# Patient Record
Sex: Male | Born: 1965 | Race: White | Hispanic: No | Marital: Single | Smoking: Never smoker
Health system: Southern US, Community
[De-identification: ages and names within clinical notes are randomized; demographics above are authoritative.]

## PROBLEM LIST (undated history)

## (undated) ENCOUNTER — Emergency Department (HOSPITAL_COMMUNITY): Payer: BLUE CROSS/BLUE SHIELD | Source: Home / Self Care

## (undated) DIAGNOSIS — E66811 Obesity, class 1: Secondary | ICD-10-CM

## (undated) DIAGNOSIS — R319 Hematuria, unspecified: Secondary | ICD-10-CM

## (undated) DIAGNOSIS — E669 Obesity, unspecified: Secondary | ICD-10-CM

## (undated) DIAGNOSIS — Z86010 Personal history of colonic polyps: Principal | ICD-10-CM

## (undated) DIAGNOSIS — U071 COVID-19: Secondary | ICD-10-CM

## (undated) DIAGNOSIS — G4733 Obstructive sleep apnea (adult) (pediatric): Secondary | ICD-10-CM

## (undated) DIAGNOSIS — K219 Gastro-esophageal reflux disease without esophagitis: Secondary | ICD-10-CM

## (undated) DIAGNOSIS — G4730 Sleep apnea, unspecified: Secondary | ICD-10-CM

## (undated) DIAGNOSIS — I10 Essential (primary) hypertension: Secondary | ICD-10-CM

## (undated) HISTORY — DX: Hematuria, unspecified: R31.9

## (undated) HISTORY — DX: Obesity, unspecified: E66.9

## (undated) HISTORY — DX: COVID-19: U07.1

## (undated) HISTORY — PX: COLONOSCOPY W/ POLYPECTOMY: SHX1380

## (undated) HISTORY — DX: Sleep apnea, unspecified: G47.30

## (undated) HISTORY — DX: Gastro-esophageal reflux disease without esophagitis: K21.9

## (undated) HISTORY — DX: Personal history of colonic polyps: Z86.010

## (undated) HISTORY — DX: Essential (primary) hypertension: I10

## (undated) HISTORY — PX: COLONOSCOPY: SHX174

## (undated) HISTORY — DX: Obesity, class 1: E66.811

## (undated) HISTORY — DX: Obstructive sleep apnea (adult) (pediatric): G47.33

---

## 2014-12-29 ENCOUNTER — Encounter: Payer: Self-pay | Admitting: Family Medicine

## 2014-12-29 ENCOUNTER — Ambulatory Visit (INDEPENDENT_AMBULATORY_CARE_PROVIDER_SITE_OTHER): Payer: BLUE CROSS/BLUE SHIELD | Admitting: Family Medicine

## 2014-12-29 VITALS — BP 158/93 | HR 62 | Temp 98.1°F | Resp 18 | Ht 72.25 in | Wt 242.0 lb

## 2014-12-29 DIAGNOSIS — R030 Elevated blood-pressure reading, without diagnosis of hypertension: Secondary | ICD-10-CM

## 2014-12-29 NOTE — Progress Notes (Signed)
Office Note 12/29/2014  CC:  Chief Complaint  Patient presents with  . Establish Care    not had PCP in years    HPI:  Austin Trevino is a 49 y.o. White male who is here to establish care. Patient's most recent primary MD: none (a mid-level provider at his employer's office did CPE's and labs fairly regularly up until this last year or so. Old records were not reviewed prior to or during today's visit.  Pt due for CPE with labs but is not fasting today. We discussed his hx of elevated bp, past tx for HTN, etc. He says he is UTD with all vaccines b/c he used to have to travel a lot for his job. He recently has been going through a divorce, also a change in jobs with current employer was stressful, so during these times he has noted more and more that his bp has been "up".   However, he is not yet ready to get back on meds w/out some convincing.  Past Medical History  Diagnosis Date  . Hypertension     treated "years ago" but he did some TLC/lost wt and was able to get off meds.    History reviewed. No pertinent past surgical history.  Family History  Problem Relation Age of Onset  . Arthritis Mother   . Hypertension Mother   . Hypertension Father   . Myelodysplastic syndrome Father   . Neuropathy Sister     amyloidosis  . Prostate cancer Paternal Grandfather     History   Social History  . Marital Status: Single    Spouse Name: N/A    Number of Children: N/A  . Years of Education: N/A   Occupational History  . Not on file.   Social History Main Topics  . Smoking status: Never Smoker   . Smokeless tobacco: Never Used  . Alcohol Use: Yes     Comment: socially but rare  . Drug Use: No  . Sexual Activity: Not on file   Other Topics Concern  . Not on file   Social History Narrative   Divorced, 3 sons in middle school/HS [facility]).   Educ: BS management from [place].  Orig from [place].   Occupation: Nurse, learning disability for [employer].   No tob,  alcohol intake occasional/social.    MEDS: none  No Known Allergies  ROS Review of Systems  Constitutional: Negative for fever and fatigue.  HENT: Negative for congestion and sore throat.   Eyes: Negative for visual disturbance.  Respiratory: Negative for cough and shortness of breath.   Cardiovascular: Negative for chest pain and palpitations.  Gastrointestinal: Negative for nausea and abdominal pain.  Genitourinary: Negative for dysuria.  Musculoskeletal: Negative for back pain and joint swelling.  Skin: Negative for rash.  Neurological: Negative for dizziness, weakness and headaches.  Hematological: Negative for adenopathy.    PE; Blood pressure 158/93, pulse 62, temperature 98.1 F (36.7 C), temperature source Temporal, resp. rate 18, height 6' 0.25" (1.835 m), weight 242 lb (109.77 kg), SpO2 96 %.  BP recheck with manual cuff, R arm, was 150/90. Gen: Alert, well appearing.  Patient is oriented to person, place, time, and situation. OQH:UTML: no injection, icteris, swelling, or exudate.  EOMI, PERRLA. Mouth: lips without lesion/swelling.  Oral mucosa pink and moist. Oropharynx without erythema, exudate, or swelling.  CV: RRR, no m/r/g.   LUNGS: CTA bilat, nonlabored resps, good aeration in all lung fields. EXT: no clubbing, cyanosis, or edema.  Pertinent labs:  None today  ASSESSMENT AND PLAN:   New pt; would like old labs from his employer's health clinic.  1) Elevated blood pressure, hx of HTN. He is set on getting back on track with diet and exercise b/c in the past on two occasions this has helped get his bp down so that he could either get off of med or avoid antihypertensive med. I have asked him to buy an upper arm bp cuff and we'll do some home bp monitoring for more data to go on. At f/u for his fasting CPE in a couple of weeks, I'll also do an EKG to check for LVH.  An After Visit Summary was printed and given to the patient.  Return for arrange CPE  (fasting) in 2 or more weeks.

## 2014-12-29 NOTE — Progress Notes (Signed)
Pre visit review using our clinic review tool, if applicable. No additional management support is needed unless otherwise documented below in the visit note. 

## 2014-12-29 NOTE — Patient Instructions (Signed)
Buy electronic bp cuff (upper arm)--approx 50$ at any drug store.

## 2015-01-12 ENCOUNTER — Encounter: Payer: Self-pay | Admitting: Family Medicine

## 2015-01-21 ENCOUNTER — Encounter: Payer: Self-pay | Admitting: Family Medicine

## 2015-01-21 ENCOUNTER — Ambulatory Visit (INDEPENDENT_AMBULATORY_CARE_PROVIDER_SITE_OTHER): Payer: BLUE CROSS/BLUE SHIELD | Admitting: Family Medicine

## 2015-01-21 ENCOUNTER — Telehealth: Payer: Self-pay | Admitting: Internal Medicine

## 2015-01-21 VITALS — BP 143/90 | HR 61 | Temp 97.7°F | Resp 18 | Ht 72.25 in | Wt 241.0 lb

## 2015-01-21 DIAGNOSIS — G4733 Obstructive sleep apnea (adult) (pediatric): Secondary | ICD-10-CM

## 2015-01-21 DIAGNOSIS — I10 Essential (primary) hypertension: Secondary | ICD-10-CM

## 2015-01-21 DIAGNOSIS — Z0000 Encounter for general adult medical examination without abnormal findings: Secondary | ICD-10-CM

## 2015-01-21 DIAGNOSIS — J387 Other diseases of larynx: Secondary | ICD-10-CM

## 2015-01-21 DIAGNOSIS — Z125 Encounter for screening for malignant neoplasm of prostate: Secondary | ICD-10-CM

## 2015-01-21 DIAGNOSIS — K219 Gastro-esophageal reflux disease without esophagitis: Secondary | ICD-10-CM

## 2015-01-21 DIAGNOSIS — Z1211 Encounter for screening for malignant neoplasm of colon: Secondary | ICD-10-CM

## 2015-01-21 DIAGNOSIS — G4710 Hypersomnia, unspecified: Secondary | ICD-10-CM

## 2015-01-21 LAB — COMPREHENSIVE METABOLIC PANEL
ALBUMIN: 4.4 g/dL (ref 3.5–5.2)
ALT: 35 U/L (ref 0–53)
AST: 20 U/L (ref 0–37)
Alkaline Phosphatase: 72 U/L (ref 39–117)
BUN: 13 mg/dL (ref 6–23)
CO2: 33 meq/L — AB (ref 19–32)
Calcium: 9.6 mg/dL (ref 8.4–10.5)
Chloride: 102 mEq/L (ref 96–112)
Creatinine, Ser: 1.11 mg/dL (ref 0.40–1.50)
GFR: 74.36 mL/min (ref >60.00)
GLUCOSE: 85 mg/dL (ref 70–99)
POTASSIUM: 4.7 meq/L (ref 3.5–5.1)
SODIUM: 140 meq/L (ref 135–145)
TOTAL PROTEIN: 7.3 g/dL (ref 6.0–8.3)
Total Bilirubin: 1.1 mg/dL (ref 0.2–1.2)

## 2015-01-21 LAB — CBC WITH DIFFERENTIAL/PLATELET
BASOS PCT: 0.4 % (ref 0.0–3.0)
Basophils Absolute: 0 10*3/uL (ref 0.0–0.1)
Eosinophils Absolute: 0.4 10*3/uL (ref 0.0–0.7)
Eosinophils Relative: 4.7 % (ref 0.0–5.0)
HEMATOCRIT: 51.1 % (ref 39.0–52.0)
HEMOGLOBIN: 17.5 g/dL — AB (ref 13.0–17.0)
LYMPHS ABS: 1.6 10*3/uL (ref 0.7–4.0)
Lymphocytes Relative: 17.9 % (ref 12.0–46.0)
MCHC: 34.1 g/dL (ref 30.0–36.0)
MCV: 92.6 fl (ref 78.0–100.0)
MONOS PCT: 10 % (ref 3.0–12.0)
Monocytes Absolute: 0.9 10*3/uL (ref 0.1–1.0)
NEUTROS ABS: 6.1 10*3/uL (ref 1.4–7.7)
Neutrophils Relative %: 67 % (ref 43.0–77.0)
Platelets: 230 10*3/uL (ref 150.0–400.0)
RBC: 5.52 Mil/uL (ref 4.22–5.81)
RDW: 14.3 % (ref 11.5–15.5)
WBC: 9.1 10*3/uL (ref 4.0–10.5)

## 2015-01-21 LAB — LIPID PANEL
CHOL/HDL RATIO: 5
Cholesterol: 200 mg/dL (ref 0–200)
HDL: 42.6 mg/dL (ref >39.00)
LDL Cholesterol: 119 mg/dL — ABNORMAL HIGH (ref 0–99)
NONHDL: 157.4
Triglycerides: 194 mg/dL — ABNORMAL HIGH (ref 0.0–149.0)
VLDL: 38.8 mg/dL (ref 0.0–40.0)

## 2015-01-21 LAB — PSA: PSA: 1.38 ng/mL (ref 0.10–4.00)

## 2015-01-21 LAB — TSH: TSH: 1.7 u[IU]/mL (ref 0.35–4.50)

## 2015-01-21 MED ORDER — PANTOPRAZOLE SODIUM 40 MG PO TBEC: 40.0000 mg | DELAYED_RELEASE_TABLET | Freq: Every day | ORAL | Status: DC

## 2015-01-21 NOTE — Progress Notes (Signed)
Pre visit review using our clinic review tool, if applicable. No additional management support is needed unless otherwise documented below in the visit note. 

## 2015-01-21 NOTE — Progress Notes (Signed)
Office Note 01/25/2015  CC:  Chief Complaint  Patient presents with  . Annual Exam    fasting   HPI:  Austin Trevino is a 49 y.o. White male who is here for fasting health maintenance exam. Pt just established care with me a few weeks ago.  He talks about his stress in life, feels tired a lot. Says he has snored loudly for years, has been told he stops breathing during sleep.  Describes excessive daytime sleepiness.  He got a bp cuff and has noted home bp's 150s over 90s avg.  HR 60s.  Describes several days a week in which he gets gagged on refluxed liquid, coughs a lot, ST often in mornings. Has never tried GER med.  Past Medical History  Diagnosis Date  . Hypertension     treated "years ago" but he did some TLC/lost wt and was able to get off meds.  . Obesity, Class I, BMI 30-34.9     No past surgical history on file.  Family History  Problem Relation Age of Onset  . Arthritis Mother   . Hypertension Mother   . Hypertension Father   . Myelodysplastic syndrome Father   . Neuropathy Sister     amyloidosis  . Prostate cancer Paternal Grandfather   . Colon polyps Mother     History   Social History  . Marital Status: Single    Spouse Name: N/A  . Number of Children: N/A  . Years of Education: N/A   Occupational History  . Not on file.   Social History Main Topics  . Smoking status: Never Smoker   . Smokeless tobacco: Never Used  . Alcohol Use: Yes     Comment: socially but rare  . Drug Use: No  . Sexual Activity: Not on file   Other Topics Concern  . Not on file   Social History Narrative   Divorced, 3 sons in middle school/HS [facility] [employer] [employer] [facility]).   Educ: BS management from [place].  Orig from [place].   Occupation: Nurse, learning disability for [employer].   No tob, alcohol intake occasional/social.   MEDS: none  No Known Allergies  ROS Review of Systems  Constitutional: Negative for fever, chills, appetite change and fatigue.  HENT: Negative  for congestion, dental problem, ear pain and sore throat.   Eyes: Negative for discharge, redness and visual disturbance.  Respiratory: Negative for cough, chest tightness, shortness of breath and wheezing.   Cardiovascular: Negative for chest pain, palpitations and leg swelling.  Gastrointestinal: Negative for nausea, vomiting, abdominal pain, diarrhea and blood in stool.  Genitourinary: Negative for dysuria, urgency, frequency, hematuria, flank pain and difficulty urinating.  Musculoskeletal: Negative for myalgias, back pain, joint swelling, arthralgias and neck stiffness.  Skin: Negative for pallor and rash.  Neurological: Negative for dizziness, speech difficulty, weakness and headaches.  Hematological: Negative for adenopathy. Does not bruise/bleed easily.  Psychiatric/Behavioral: Positive for sleep disturbance. Negative for confusion. The patient is nervous/anxious ("stressed").     PE; Blood pressure 143/90, pulse 61, temperature 97.7 F (36.5 C), temperature source Temporal, resp. rate 18, height 6' 0.25" (1.835 m), weight 241 lb (109.317 kg), SpO2 97 %.  BMI 32.5 Gen: Alert, well appearing.  Patient is oriented to person, place, time, and situation. AFFECT: pleasant, lucid thought and speech. ENT: Ears: EACs clear, normal epithelium.  TMs with good light reflex and landmarks bilaterally.  Eyes: no injection, icteris, swelling, or exudate.  EOMI, PERRLA. Nose: no drainage or turbinate edema/swelling.  No  injection or focal lesion.  Mouth: lips without lesion/swelling.  Oral mucosa pink and moist.  Dentition intact and without obvious caries or gingival swelling.  Oropharynx with diffuse erythema including the soft palate.  No exudate, focal lesion, asymmetry, or swelling.  Neck: supple/nontender.  No LAD, mass, or TM.  Carotid pulses 2+ bilaterally, without bruits. CV: RRR, no m/r/g.   LUNGS: CTA bilat, nonlabored resps, good aeration in all lung fields. ABD: soft, NT, ND, BS normal.   No hepatospenomegaly or mass.  No bruits. EXT: no clubbing, cyanosis, or edema.  Musculoskeletal: no joint swelling, erythema, warmth, or tenderness.  ROM of all joints intact. Skin - no sores or suspicious lesions or rashes or color changes   Pertinent labs:  None today  ASSESSMENT AND PLAN:   1) HTN, stage 1.  Pt not agreeable to start of med at this time.  Wants to do TLC only + home bp monitoring and re-evaluate in 3 mo.  2) Laryngopharyngeal reflux: discussed dietary changes, GER diet handout given, discussed behavioral changes and wt loss, start daily pantoprazole 40mg  qAM.  3) OSA: refer to pulm.  Emphasized importance of wt loss.  4) Health maintenance exam:  Reviewed age and gender appropriate health maintenance issues (prudent diet, regular exercise, health risks of tobacco and excessive alcohol, use of seatbelts, fire alarms in home, use of sunscreen).  Also reviewed age and gender appropriate health screening as well as vaccine recommendations. HP and PSA labs drawn today.  Pt declined HIV screening. Referred to [facility] for colon cancer screening. Getting started with regular exercise was the chief recommendation I had for him today.  An After Visit Summary was printed and given to the patient.   FOLLOW UP:  Return in about 3 months (around 04/21/2015) for f/u HTN and LPR.

## 2015-01-21 NOTE — Telephone Encounter (Signed)
ERROR

## 2015-01-22 ENCOUNTER — Telehealth: Payer: Self-pay | Admitting: Family Medicine

## 2015-01-22 NOTE — Telephone Encounter (Signed)
emmi emailed °

## 2015-01-22 NOTE — Progress Notes (Signed)
Pls notify pt also that I FORGOT to do the rectal exam of his prostate when he was here recently. I will do that at his next f/u visit if that's ok with him.-thx

## 2015-01-27 ENCOUNTER — Ambulatory Visit (AMBULATORY_SURGERY_CENTER): Payer: Self-pay

## 2015-01-27 VITALS — Ht 73.0 in | Wt 244.8 lb

## 2015-01-27 DIAGNOSIS — Z1211 Encounter for screening for malignant neoplasm of colon: Secondary | ICD-10-CM

## 2015-01-27 DIAGNOSIS — G4733 Obstructive sleep apnea (adult) (pediatric): Secondary | ICD-10-CM

## 2015-01-27 HISTORY — DX: Obstructive sleep apnea (adult) (pediatric): G47.33

## 2015-01-27 NOTE — Progress Notes (Signed)
Per pt, no allergies to soy or egg products.Pt not taking any weight loss meds or using  O2 at home. 

## 2015-02-10 ENCOUNTER — Encounter: Payer: Self-pay | Admitting: Internal Medicine

## 2015-02-10 ENCOUNTER — Ambulatory Visit (AMBULATORY_SURGERY_CENTER): Payer: BLUE CROSS/BLUE SHIELD | Admitting: Internal Medicine

## 2015-02-10 VITALS — BP 158/81 | HR 55 | Temp 97.9°F | Resp 24 | Ht 73.0 in | Wt 244.0 lb

## 2015-02-10 DIAGNOSIS — K621 Rectal polyp: Secondary | ICD-10-CM

## 2015-02-10 DIAGNOSIS — Z1211 Encounter for screening for malignant neoplasm of colon: Secondary | ICD-10-CM

## 2015-02-10 DIAGNOSIS — D120 Benign neoplasm of cecum: Secondary | ICD-10-CM | POA: Diagnosis not present

## 2015-02-10 DIAGNOSIS — K635 Polyp of colon: Secondary | ICD-10-CM

## 2015-02-10 DIAGNOSIS — D122 Benign neoplasm of ascending colon: Secondary | ICD-10-CM | POA: Diagnosis not present

## 2015-02-10 DIAGNOSIS — D125 Benign neoplasm of sigmoid colon: Secondary | ICD-10-CM

## 2015-02-10 DIAGNOSIS — D123 Benign neoplasm of transverse colon: Secondary | ICD-10-CM

## 2015-02-10 MED ORDER — SODIUM CHLORIDE 0.9 % IV SOLN
500.0000 mL | INTRAVENOUS | Status: DC
Start: 2015-02-10 — End: 2015-02-10

## 2015-02-10 NOTE — Progress Notes (Signed)
To recovery, report to Hodges,RN. VSS 

## 2015-02-10 NOTE — Progress Notes (Signed)
Called to room to assist during endoscopic procedure.  Patient ID and intended procedure confirmed with present staff. Received instructions for my participation in the procedure from the performing physician.  

## 2015-02-10 NOTE — Patient Instructions (Addendum)
I found and removed 10 polyps - all look benign. I will let you know pathology results and when to have another routine colonoscopy by mail.  You also have a condition called diverticulosis - common and not usually a problem. Please read the handout provided.  I appreciate the opportunity to care for you. Gatha Mayer, MD, FACG  HOLD ALL ASPIRIN AND NSAIDS FOR TWO WEEKS PER DR Yareni Creps    YOU HAD AN ENDOSCOPIC PROCEDURE TODAY AT [facility]:   Refer to the procedure report that was given to you for any specific questions about what was found during the examination.  If the procedure report does not answer your questions, please call your gastroenterologist to clarify.  If you requested that your care partner not be given the details of your procedure findings, then the procedure report has been included in a sealed envelope for you to review at your convenience later.  YOU SHOULD EXPECT: Some feelings of bloating in the abdomen. Passage of more gas than usual.  Walking can help get rid of the air that was put into your GI tract during the procedure and reduce the bloating. If you had a lower endoscopy (such as a colonoscopy or flexible sigmoidoscopy) you may notice spotting of blood in your stool or on the toilet paper. If you underwent a bowel prep for your procedure, you may not have a normal bowel movement for a few days.  Please Note:  You might notice some irritation and congestion in your nose or some drainage.  This is from the oxygen used during your procedure.  There is no need for concern and it should clear up in a day or so.  SYMPTOMS TO REPORT IMMEDIATELY:   Following lower endoscopy (colonoscopy or flexible sigmoidoscopy):  Excessive amounts of blood in the stool  Significant tenderness or worsening of abdominal pains  Swelling of the abdomen that is new, acute  Fever of 100F or higher   For urgent or emergent issues, a gastroenterologist can be  reached at any hour by calling [phone].   DIET: Your first meal following the procedure should be a small meal and then it is ok to progress to your normal diet. Heavy or fried foods are harder to digest and may make you feel nauseous or bloated.  Likewise, meals heavy in dairy and vegetables can increase bloating.  Drink plenty of fluids but you should avoid alcoholic beverages for 24 hours.  ACTIVITY:  You should plan to take it easy for the rest of today and you should NOT DRIVE or use heavy machinery until tomorrow (because of the sedation medicines used during the test).    FOLLOW UP: Our staff will call the number listed on your records the next business day following your procedure to check on you and address any questions or concerns that you may have regarding the information given to you following your procedure. If we do not reach you, we will leave a message.  However, if you are feeling well and you are not experiencing any problems, there is no need to return our call.  We will assume that you have returned to your regular daily activities without incident.  If any biopsies were taken you will be contacted by phone or by letter within the next 1-3 weeks.  Please call us at [phone] if you have not heard about the biopsies in 3 weeks.    SIGNATURES/CONFIDENTIALITY: You and/or your care partner  have signed paperwork which will be entered into your electronic medical record.  These signatures attest to the fact that that the information above on your After Visit Summary has been reviewed and is understood.  Full responsibility of the confidentiality of this discharge information lies with you and/or your care-partner.  Read all of the handouts given to you by your recovery room nurse.  We will see you in one year for a repeat colonoscopy due to your numerous polyps.

## 2015-02-10 NOTE — Op Note (Signed)
[place]  [address]. [place], [address]   COLONOSCOPY PROCEDURE REPORT  PATIENT: Austin, Trevino  MR#: [id number] BIRTHDATE: 09-May-1966 , 48  yrs. old GENDER: male ENDOSCOPIST: Gatha Mayer, MD, [facility] PROCEDURE DATE:  02/10/2015 PROCEDURE:   Colonoscopy, screening, Colonoscopy with biopsy, and Colonoscopy with snare polypectomy First Screening Colonoscopy - Avg.  risk and is 50 yrs.  old or older Yes.  Prior Negative Screening - Now for repeat screening. N/A  History of Adenoma - Now for follow-up colonoscopy & has been > or = to 3 yrs.  N/A ASA CLASS:   Class II INDICATIONS:Screening for colonic neoplasia and Colorectal Neoplasm Risk Assessment for this procedure is average risk. MEDICATIONS: Propofol 400 mg IV and Monitored anesthesia care  DESCRIPTION OF PROCEDURE:   After the risks benefits and alternatives of the procedure were thoroughly explained, informed consent was obtained.  The digital rectal exam revealed no abnormalities of the rectum, revealed no prostatic nodules, and revealed the prostate was not enlarged.   The LB [id number] endoscope was introduced through the anus and advanced to the cecum, which was identified by both the appendix and ileocecal valve. No adverse events experienced.   The quality of the prep was good.  (MiraLax was used)  The instrument was then slowly withdrawn as the colon was fully examined.      COLON FINDINGS: Ten polyps (sessile or semi-pedunculated) ranging from 2 to 65mm in size were found in the descending colon, sigmoid colon, rectum, at the ileocecal valve, in the ascending colon, and transverse colon.  Polypectomies  were performed using snare cautery (IC valve, ascending and sigmoid), with a cold snare (ascending and trransverse)and with cold forceps (ascending and transverse).  The resection was complete, the polyp tissue was completely retrieved and sent to histology. No combined  polypectomy techniques.  There was diverticulosis noted in the sigmoid colon. The examination was otherwise normal.   Right colon retroflexion included.  Retroflexed views revealed no abnormalities. The time to cecum = 1.3 Withdrawal time = 25.1   The scope was withdrawn and the procedure completed. COMPLICATIONS: There were no immediate complications.  ENDOSCOPIC IMPRESSION: 1.   Ten sessile polyps ranging from 2 to 54mm in size were found in the descending colon, sigmoid colon, rectum, at the ileocecal valve, in the ascending colon, and transverse colon; polypectomies were performed using snare cautery, with a cold snare and with cold forceps. Largest was a 15 mm sessile ascending polyp - laterally speading) 2.   Diverticulosis was noted in the sigmoid colon 3.   The examination was otherwise normal  RECOMMENDATIONS: 1.  Hold Aspirin and all other NSAIDS for 2 weeks. 2.  Timing of repeat colonoscopy will be determined by pathology findings. Probably 1 year  eSigned:  Gatha Mayer, MD, [facility] 02/10/2015 10:55 AM   cc: Crissie Sickles, MD and The Patient   PATIENT NAME:  Austin, Trevino MR#: [id number]

## 2015-02-11 ENCOUNTER — Telehealth: Payer: Self-pay

## 2015-02-11 NOTE — Telephone Encounter (Signed)
  Follow up Call-  Call back number 02/10/2015  Post procedure Call Back phone  # [phone]  Permission to leave phone message Yes     Patient questions:  Do you have a fever, pain , or abdominal swelling? No. Pain Score  0 *  Have you tolerated food without any problems? Yes.    Have you been able to return to your normal activities? Yes.    Do you have any questions about your discharge instructions: Diet   No. Medications  No. Follow up visit  No.  Do you have questions or concerns about your Care? No.  Actions: * If pain score is 4 or above: No action needed, pain <4.

## 2015-02-18 ENCOUNTER — Encounter: Payer: Self-pay | Admitting: Internal Medicine

## 2015-02-18 DIAGNOSIS — Z860100 Personal history of colon polyps, unspecified: Secondary | ICD-10-CM

## 2015-02-18 HISTORY — DX: Personal history of colon polyps, unspecified: Z86.0100

## 2015-02-18 HISTORY — DX: Personal history of colonic polyps: Z86.010

## 2015-03-16 ENCOUNTER — Encounter: Payer: Self-pay | Admitting: Pulmonary Disease

## 2015-03-16 ENCOUNTER — Ambulatory Visit (INDEPENDENT_AMBULATORY_CARE_PROVIDER_SITE_OTHER): Payer: BLUE CROSS/BLUE SHIELD | Admitting: Pulmonary Disease

## 2015-03-16 VITALS — BP 148/74 | HR 61 | Temp 98.7°F | Ht 73.0 in | Wt 240.6 lb

## 2015-03-16 DIAGNOSIS — G4733 Obstructive sleep apnea (adult) (pediatric): Secondary | ICD-10-CM | POA: Insufficient documentation

## 2015-03-16 NOTE — Progress Notes (Signed)
   Subjective:    Patient ID: Austin Trevino, male    DOB: 1966/11/19, 49 y.o.   MRN: [id number]  HPI The patient is a 49 year old male who I've been asked to see for possible obstructive sleep apnea. He has been noted to have loud snoring, as well as observed apneas. He has frequent awakenings during the night, and the majority of the time he is unrested upon arising. He notes inappropriate daytime sleepiness with quiet moments, and variable sleepiness in the evenings watching television or movies. He denies any sleepiness with driving. The patient states that his weight is been stable over the last 2 years, and his Epworth score today is 7.   Sleep Questionnaire What time do you typically go to bed?( Between what hours) 11p-12a 11p-12a at 1515 on 03/16/15 by Inge Rise, CMA How long does it take you to fall asleep? quickly quickly at 1515 on 03/16/15 by Inge Rise, CMA How many times during the night do you wake up? 5 5 at 1515 on 03/16/15 by Inge Rise, CMA What time do you get out of bed to start your day? 0500 0500 at 1515 on 03/16/15 by Inge Rise, CMA Do you drive or operate heavy machinery in your occupation? No No at 1515 on 03/16/15 by Inge Rise, CMA How much has your weight changed (up or down) over the past two years? (In pounds) 0 oz (0 kg) 0 oz (0 kg) at 1515 on 03/16/15 by Inge Rise, CMA Have you ever had a sleep study before? No No at 1515 on 03/16/15 by Inge Rise, CMA Do you currently use CPAP? No No at 1515 on 03/16/15 by Inge Rise, CMA Do you wear oxygen at any time? No No at 1515 on 03/16/15 by Inge Rise, CMA   Review of Systems  Constitutional: Negative for fever and unexpected weight change.  HENT: Positive for sore throat. Negative for congestion, dental problem, ear pain, nosebleeds, postnasal drip, rhinorrhea, sinus pressure, sneezing and trouble swallowing.   Eyes: Negative for redness and itching.  Respiratory:  Positive for cough and shortness of breath. Negative for chest tightness and wheezing.   Cardiovascular: Negative for palpitations and leg swelling.  Gastrointestinal: Negative for nausea and vomiting.  Genitourinary: Negative for dysuria.  Musculoskeletal: Negative for joint swelling.  Skin: Negative for rash.  Neurological: Negative for headaches.  Hematological: Does not bruise/bleed easily.  Psychiatric/Behavioral: Negative for dysphoric mood. The patient is nervous/anxious.        Objective:   Physical Exam Constitutional:  Overweight male, no acute distress  HENT:  Nares patent without discharge  Oropharynx without exudate, palate and uvula are thick and elongated  Eyes:  Perrla, eomi, no scleral icterus  Neck:  No JVD, no TMG  Cardiovascular:  Normal rate, regular rhythm, no rubs or gallops.  No murmurs        Intact distal pulses  Pulmonary :  Normal breath sounds, no stridor or respiratory distress   No rales, rhonchi, or wheezing  Abdominal:  Soft, nondistended, bowel sounds present.  No tenderness noted.   Musculoskeletal:  No lower extremity edema noted.  Lymph Nodes:  No cervical lymphadenopathy noted  Skin:  No cyanosis noted  Neurologic:  Alert, appropriate, moves all 4 extremities without obvious deficit.         Assessment & Plan:

## 2015-03-16 NOTE — Patient Instructions (Signed)
Will arrange for home sleep testing, and will call once results are available Work on weight loss.

## 2015-03-16 NOTE — Assessment & Plan Note (Signed)
The patient's history is very suggestive of clinically significant sleep disordered breathing. I have had a long conversation with him about sleep apnea, including its impact to his quality of life and cardiovascular health. He will need a sleep study for diagnosis, and he has a very good candidate for home sleep testing. The patient is agreeable to this approach.

## 2015-03-25 ENCOUNTER — Encounter: Payer: Self-pay | Admitting: Family Medicine

## 2015-04-24 ENCOUNTER — Ambulatory Visit: Payer: BLUE CROSS/BLUE SHIELD | Admitting: Family Medicine

## 2015-05-15 ENCOUNTER — Other Ambulatory Visit: Payer: Self-pay | Admitting: Pulmonary Disease

## 2015-05-15 DIAGNOSIS — G4733 Obstructive sleep apnea (adult) (pediatric): Secondary | ICD-10-CM

## 2015-05-29 ENCOUNTER — Ambulatory Visit: Payer: BLUE CROSS/BLUE SHIELD | Admitting: Family Medicine

## 2015-06-29 ENCOUNTER — Encounter: Payer: Self-pay | Admitting: Family Medicine

## 2015-06-29 ENCOUNTER — Ambulatory Visit (INDEPENDENT_AMBULATORY_CARE_PROVIDER_SITE_OTHER): Payer: BLUE CROSS/BLUE SHIELD | Admitting: Family Medicine

## 2015-06-29 VITALS — BP 140/84 | HR 60 | Temp 98.1°F | Ht 73.0 in | Wt 241.0 lb

## 2015-06-29 DIAGNOSIS — R21 Rash and other nonspecific skin eruption: Secondary | ICD-10-CM

## 2015-06-29 MED ORDER — HYDROCORTISONE 0.5 % EX CREA: 1.0000 "application " | TOPICAL_CREAM | Freq: Two times a day (BID) | CUTANEOUS | Status: DC

## 2015-06-29 MED ORDER — PREDNISONE 50 MG PO TABS: 50.0000 mg | ORAL_TABLET | Freq: Every day | ORAL | Status: DC

## 2015-06-29 NOTE — Assessment & Plan Note (Signed)
Austin Trevino is a 49 y.o. male presents to OV for new onset of rash. - rash appears to be consistent with atopic dermatitis by appearance, although not pruritic. Considered  shingles considering location and pattern, however not painful or sensitive to him.  - Prednisone x5 days, not diabetic. - hydrocortisone topical BID prescribed.  - AVS contact derm, discussed watching for possible irritants.  - F/u 1 week if not resolving, or sooner if worsening.

## 2015-06-29 NOTE — Progress Notes (Signed)
   Subjective:    Patient ID: Austin Trevino, male    DOB: 1966/11/10, 49 y.o.   MRN: [id number]  HPI  Rash: patient presents with a rash over his left shoulder blade that started Saturday with a small "bump". He reports it has spread since Saturday from 1 bump to a large area on his back. He admits to a similar reaction a few months ago on his wrist, but that rash was pruritic and went away quickly. He denies pain, itchiness or drainage to the rash. He denies fever, nausea, vomit or headache. He does not recall changing any laundry detergent, soaps, shampoos etc. He has not been exposed to plants/outdoors that he is aware of. He reports finding occasional ticks "crawling" but nothing recent and no tick bites. He has no allergies he is aware of. He has not change any medications or travel outside [place]. He has not tried any OTC to help with rash.   Never Smoker Past Medical History  Diagnosis Date  . Hypertension     treated "years ago" but he did some TLC/lost wt and was able to get off meds.  . Obesity, Class I, BMI 30-34.9   . GERD (gastroesophageal reflux disease)   . Hx of colonic polyps - adenomas and ssp 02/18/2015  . OSA (obstructive sleep apnea) 01/2015    Suspected: eval by Dr. Gwenette Greet 02/2015, sleep study recommended.   No Known Allergies   Review of Systems Negative, with the exception of above mentioned in HPI     Objective:   Physical Exam BP 140/84 mmHg  Pulse 60  Temp(Src) 98.1 F (36.7 C) (Oral)  Ht 6\' 1"  (1.854 m)  Wt 241 lb (109.317 kg)  BMI 31.80 kg/m2  SpO2 96% Gen: NAD. Nontoxic in appearance, pleasant caucasian male. Well dressed, well nourished.  Skin: No  purpura or petechiae. Moderately raised red, vesicular like rash left shoulder blade ~4x3 inches, and left bicep 3-4 small raised red bumps.       Assessment & Plan:  Zayan Delvecchio is a 49 y.o. male presents to OV for new onset of rash. - rash appears to be consistent with atopic dermatitis by  appearance, although not pruritic. Considered  shingles considering location and pattern, however not painful or sensitive to him.  - Prednisone x5 days, not diabetic. - hydrocortisone topical BID prescribed.  - AVS contact derm, discussed watching for possible irritants.  - F/u 1 week if not resolving, or sooner if worsening.

## 2015-06-29 NOTE — Patient Instructions (Signed)
I have prescribed you prednisone for 5 days, this is a steroid. You should see an improvement within a few days. If you do not see any improvement or the rash worsens, becomes painful or appears infected then please be seen immediately.  Follow up in one week in if no improvement. It was a pleasure meeting you.   Contact Dermatitis Contact dermatitis is a reaction to certain substances that touch the skin. Contact dermatitis can be either irritant contact dermatitis or allergic contact dermatitis. Irritant contact dermatitis does not require previous exposure to the substance for a reaction to occur.Allergic contact dermatitis only occurs if you have been exposed to the substance before. Upon a repeat exposure, your body reacts to the substance.  CAUSES  Many substances can cause contact dermatitis. Irritant dermatitis is most commonly caused by repeated exposure to mildly irritating substances, such as:  Makeup.  Soaps.  Detergents.  Bleaches.  Acids.  Metal salts, such as nickel. Allergic contact dermatitis is most commonly caused by exposure to:  Poisonous plants.  Chemicals (deodorants, shampoos).  Jewelry.  Latex.  Neomycin in triple antibiotic cream.  Preservatives in products, including clothing. SYMPTOMS  The area of skin that is exposed may develop:  Dryness or flaking.  Redness.  Cracks.  Itching.  Pain or a burning sensation.  Blisters. With allergic contact dermatitis, there may also be swelling in areas such as the eyelids, mouth, or genitals.  DIAGNOSIS  Your caregiver can usually tell what the problem is by doing a physical exam. In cases where the cause is uncertain and an allergic contact dermatitis is suspected, a patch skin test may be performed to help determine the cause of your dermatitis. TREATMENT Treatment includes protecting the skin from further contact with the irritating substance by avoiding that substance if possible. Barrier creams,  powders, and gloves may be helpful. Your caregiver may also recommend:  Steroid creams or ointments applied 2 times daily. For best results, soak the rash area in cool water for 20 minutes. Then apply the medicine. Cover the area with a plastic wrap. You can store the steroid cream in the refrigerator for a "chilly" effect on your rash. That may decrease itching. Oral steroid medicines may be needed in more severe cases.  Antibiotics or antibacterial ointments if a skin infection is present.  Antihistamine lotion or an antihistamine taken by mouth to ease itching.  Lubricants to keep moisture in your skin.  Burow's solution to reduce redness and soreness or to dry a weeping rash. Mix one packet or tablet of solution in 2 cups cool water. Dip a clean washcloth in the mixture, wring it out a bit, and put it on the affected area. Leave the cloth in place for 30 minutes. Do this as often as possible throughout the day.  Taking several cornstarch or baking soda baths daily if the area is too large to cover with a washcloth. Harsh chemicals, such as alkalis or acids, can cause skin damage that is like a burn. You should flush your skin for 15 to 20 minutes with cold water after such an exposure. You should also seek immediate medical care after exposure. Bandages (dressings), antibiotics, and pain medicine may be needed for severely irritated skin.  HOME CARE INSTRUCTIONS  Avoid the substance that caused your reaction.  Keep the area of skin that is affected away from hot water, soap, sunlight, chemicals, acidic substances, or anything else that would irritate your skin.  Do not scratch the rash. Scratching  may cause the rash to become infected.  You may take cool baths to help stop the itching.  Only take over-the-counter or prescription medicines as directed by your caregiver.  See your caregiver for follow-up care as directed to make sure your skin is healing properly. SEEK MEDICAL CARE IF:    Your condition is not better after 3 days of treatment.  You seem to be getting worse.  You see signs of infection such as swelling, tenderness, redness, soreness, or warmth in the affected area.  You have any problems related to your medicines. Document Released: 11/11/2000 Document Revised: 02/06/2012 Document Reviewed: 04/19/2011 [facility] Patient Information 2015 [place], [place]. This information is not intended to replace advice given to you by your health care provider. Make sure you discuss any questions you have with your health care provider.

## 2015-06-30 ENCOUNTER — Telehealth: Payer: Self-pay | Admitting: Pulmonary Disease

## 2015-06-30 ENCOUNTER — Telehealth: Payer: Self-pay | Admitting: *Deleted

## 2015-06-30 ENCOUNTER — Telehealth: Payer: Self-pay | Admitting: Family Medicine

## 2015-06-30 NOTE — Telephone Encounter (Signed)
Order for Home Sleep Test put in 03/16/2015. Patient was called 4 times and schedule 1 time with no show

## 2015-06-30 NOTE — Telephone Encounter (Signed)
Error

## 2015-06-30 NOTE — Telephone Encounter (Signed)
Austin Trevino called and said his rash is spreading, it has developed blisters and is very sensitive. Pharmacy messed up his RX for prednisone so he has not started it yet. DO you want him to start it and do you think he has shingles? Please advise? His cell number is [phone]

## 2015-06-30 NOTE — Telephone Encounter (Signed)
Returned patient call concerning his rash. He states the rash has spread, but he had not had the chance to start the prednisone. He has noticed it is spreading up close to his neck (was over shoulder blade C5/C6/C7/C8/T1 dermatome left). He also noticed the area on his left bicep has a new area, which a few inches away and higher on his arm. This would also be in a different dermatome pattern by description, making shingles less likely. He does admit to more irritation/mild burn to the area today, but not painful. - advised patient to start prednisone (he was able to start it 2 hours ago).  - Continue topical cream/steroid. - monitor closely over next 48 hours. If improves with steroid continue, if becomes worse over the next 24-48 hours he needs discontinue steroid and be seen again.  [place] DO

## 2015-07-27 ENCOUNTER — Other Ambulatory Visit: Payer: Self-pay | Admitting: *Deleted

## 2015-07-27 MED ORDER — PANTOPRAZOLE SODIUM 40 MG PO TBEC: 40.0000 mg | DELAYED_RELEASE_TABLET | Freq: Every day | ORAL | Status: DC

## 2015-07-27 NOTE — Telephone Encounter (Signed)
RF request for pantoprazole LOV: 06/29/15 Next ov:  None Last written: 01/21/15 #30 w/ #RF

## 2015-12-23 ENCOUNTER — Other Ambulatory Visit: Payer: Self-pay | Admitting: *Deleted

## 2015-12-23 MED ORDER — PANTOPRAZOLE SODIUM 40 MG PO TBEC: 40.0000 mg | DELAYED_RELEASE_TABLET | Freq: Every day | ORAL | Status: DC

## 2015-12-23 NOTE — Telephone Encounter (Signed)
RF request for pantoprazole LOV: 01/21/15 Next ov: None Last written: 07/27/15 #30 w/ 3RF

## 2016-01-20 ENCOUNTER — Ambulatory Visit (INDEPENDENT_AMBULATORY_CARE_PROVIDER_SITE_OTHER): Payer: BLUE CROSS/BLUE SHIELD | Admitting: Family Medicine

## 2016-01-20 ENCOUNTER — Encounter: Payer: Self-pay | Admitting: Family Medicine

## 2016-01-20 VITALS — BP 119/83 | HR 75 | Temp 98.1°F | Resp 20 | Wt 233.0 lb

## 2016-01-20 DIAGNOSIS — K529 Noninfective gastroenteritis and colitis, unspecified: Secondary | ICD-10-CM | POA: Diagnosis not present

## 2016-01-20 MED ORDER — ONDANSETRON HCL 4 MG PO TABS: 4.0000 mg | ORAL_TABLET | Freq: Three times a day (TID) | ORAL | Status: DC | PRN

## 2016-01-20 MED ORDER — DICYCLOMINE HCL 10 MG PO CAPS: 10.0000 mg | ORAL_CAPSULE | Freq: Three times a day (TID) | ORAL | Status: DC

## 2016-01-20 MED ORDER — DIPHENOXYLATE-ATROPINE 2.5-0.025 MG PO TABS: 2.0000 | ORAL_TABLET | Freq: Two times a day (BID) | ORAL | Status: DC | PRN

## 2016-01-20 NOTE — Progress Notes (Signed)
Patient ID: Austin Trevino, male   DOB: 1966/05/23, 50 y.o.   MRN: [id number]    Austin Trevino , 06-05-1966, 50 y.o., male MRN: [id number]  CC: diarrhea Subjective: Pt presents for an acute OV with complaints of diarrhea of 1 week duration. Associated symptoms include watery diarrhea, decreased appetite, fatigue, chills and night sweats (which have resolved) . Pt was unable to eat for 2 days. Pt feels symptoms are worse with eating, causing cramping. No nausea or vomit with eating, but decreased appetite.   Pt has tried imodium to ease his symptoms, without stoppage of diarrhea. Having 2-3 stools daily now.  Last stool: diarrhea this morning, no melena, hematochezia.  2 days prior to onset he had raw oysters two days prior, with others, no one else is ill. The night prior he had Mr roast beef from a  [employer] in [place], [place].  He reports no exposure to sick contacts, but his son has caught his illness.   No Known Allergies Social History  Substance Use Topics  . Smoking status: Never Smoker   . Smokeless tobacco: Never Used  . Alcohol Use: No     Comment: socially but rare   Past Medical History  Diagnosis Date  . Hypertension     treated "years ago" but he did some TLC/lost wt and was able to get off meds.  . Obesity, Class I, BMI 30-34.9   . GERD (gastroesophageal reflux disease)   . Hx of colonic polyps - adenomas and ssp 02/18/2015  . OSA (obstructive sleep apnea) 01/2015    Suspected: eval by Dr. Gwenette Greet 02/2015, sleep study recommended.   Past Surgical History  Procedure Laterality Date  . No past surgeries     Family History  Problem Relation Age of Onset  . Arthritis Mother   . Hypertension Mother   . Hypertension Father   . Myelodysplastic syndrome Father   . Neuropathy Sister     amyloidosis  . Prostate cancer Paternal Grandfather   . Colon polyps Mother   . Asthma Paternal Grandmother   . Cancer Father      Medication List       This list is accurate as of:  01/20/16 11:15 AM.  Always use your most recent med list.               IMODIUM PO  Take by mouth.     multivitamin tablet  Take 1 tablet by mouth daily.     pantoprazole 40 MG tablet  Commonly known as:  PROTONIX  Take 1 tablet (40 mg total) by mouth daily.       ROS: Negative, with the exception of above mentioned in HPI  Objective:  BP 119/83 mmHg  Pulse 75  Temp(Src) 98.1 F (36.7 C)  Resp 20  Wt 233 lb (105.688 kg)  SpO2 98% Body mass index is 30.75 kg/(m^2). Gen: Afebrile. No acute distress. Nontoxic in appearance. Well developed , well nourished.  HENT: AT. [place]. Tacky mucous membranes.  Eyes:Pupils Equal Round Reactive to light, Extraocular movements intact,  Conjunctiva without redness, discharge or icterus. CV: RRR  Abd: Soft. Obese . ND. Mild tenderness epigastric. BS present.  No  Masses palpated. No rebound or guarding. Skin: No  rashes, purpura or petechiae.  Neuro: Normal gait. PERLA. EOMi. Alert. Oriented x3    Assessment/Plan: Austin Trevino is a 50 y.o. male present for acute OV for  1. Gastroenteritis - rest and hydrate. Zofran and  bentyl scheduled for 48 hours  so able to hydrate/tolerate food.  - Lomotil sparingly, as needed.   - ondansetron (ZOFRAN) 4 MG tablet; Take 1 tablet (4 mg total) by mouth every 8 (eight) hours as needed for nausea or vomiting.  Dispense: 30 tablet; Refill: 0 - dicyclomine (BENTYL) 10 MG capsule; Take 1 capsule (10 mg total) by mouth 4 (four) times daily -  before meals and at bedtime.  Dispense: 30 capsule; Refill: 0 - diphenoxylate-atropine (LOMOTIL) 2.5-0.025 MG tablet; Take 2 tablets by mouth 2 (two) times daily as needed for diarrhea or loose stools.  Dispense: 30 tablet; Refill: 0   electronically signed by:  Howard Pouch, DO  [place]

## 2016-01-20 NOTE — Patient Instructions (Signed)
Norovirus Infection A norovirus infection is caused by exposure to a virus in a group of similar viruses (noroviruses). This type of infection causes inflammation in your stomach and intestines (gastroenteritis). Norovirus is the most common cause of gastroenteritis. It also causes food poisoning. Anyone can get a norovirus infection. It spreads very easily (contagious). You can get it from contaminated food, water, surfaces, or other people. Norovirus is found in the stool or vomit of infected people. You can spread the infection as soon as you feel sick until 2 weeks after you recover.  Symptoms usually begin within 2 days after you become infected. Most norovirus symptoms affect the digestive system. CAUSES Norovirus infection is caused by contact with norovirus. You can catch norovirus if you:  Eat or drink something contaminated with norovirus.  Touch surfaces or objects contaminated with norovirus and then put your hand in your mouth.  Have direct contact with an infected person who has symptoms.  Share food, drink, or utensils with someone with who is sick with norovirus. SIGNS AND SYMPTOMS Symptoms of norovirus may include:  Nausea.  Vomiting.  Diarrhea.  Stomach cramps.  Fever.  Chills.  Headache.  Muscle aches.  Tiredness. DIAGNOSIS Your health care provider may suspect norovirus based on your symptoms and physical exam. Your health care provider may also test a sample of your stool or vomit for the virus.  TREATMENT There is no specific treatment for norovirus. Most people get better without treatment in about 2 days. HOME CARE INSTRUCTIONS  Replace lost fluids by drinking plenty of water or rehydration fluids containing important minerals called electrolytes. This prevents dehydration. Drink enough fluid to keep your urine clear or pale yellow.  Do not prepare food for others while you are infected. Wait at least 3 days after recovering from the illness to do  that. PREVENTION   Wash your hands often, especially after using the toilet or changing a diaper.  Wash fruits and vegetables thoroughly before preparing or serving them.  Throw out any food that a sick person may have touched.  Disinfect contaminated surfaces immediately after someone in the household has been sick. Use a bleach-based household cleaner.  Immediately remove and wash soiled clothes or sheets. SEEK MEDICAL CARE IF:  Your vomiting, diarrhea, and stomach pain is getting worse.  Your symptoms of norovirus do not go away after 2-3 days. SEEK IMMEDIATE MEDICAL CARE IF:  You develop symptoms of dehydration that do not improve with fluid replacement. This may include:  Excessive sleepiness.  Lack of tears.  Dry mouth.  Dizziness when standing.  Weak pulse.   This information is not intended to replace advice given to you by your health care provider. Make sure you discuss any questions you have with your health care provider.   Document Released: 02/04/2003 Document Revised: 12/05/2014 Document Reviewed: 04/24/2014 Elsevier Interactive Patient Education [employer].   I  Have called in lomotil, antispasmodic and anti-nausea.

## 2016-03-25 ENCOUNTER — Encounter: Payer: Self-pay | Admitting: Internal Medicine

## 2016-07-15 ENCOUNTER — Encounter: Payer: Self-pay | Admitting: Internal Medicine

## 2016-07-28 ENCOUNTER — Ambulatory Visit (AMBULATORY_SURGERY_CENTER): Payer: Self-pay

## 2016-07-28 ENCOUNTER — Encounter: Payer: Self-pay | Admitting: Internal Medicine

## 2016-07-28 VITALS — Ht 73.0 in | Wt 245.0 lb

## 2016-07-28 DIAGNOSIS — Z860100 Personal history of colon polyps, unspecified: Secondary | ICD-10-CM

## 2016-07-28 NOTE — Progress Notes (Signed)
No allergies to eggs or soy No past problems with anesthesia No diet meds No home oxygen  Has email and internet; declined emmi  Dr Carlean Purl Pt reported remote hx of rectal bleeding that he is concerned about.

## 2016-07-29 ENCOUNTER — Encounter: Payer: BLUE CROSS/BLUE SHIELD | Admitting: Internal Medicine

## 2016-08-11 ENCOUNTER — Encounter: Payer: Self-pay | Admitting: Internal Medicine

## 2016-08-11 ENCOUNTER — Ambulatory Visit (AMBULATORY_SURGERY_CENTER): Payer: BLUE CROSS/BLUE SHIELD | Admitting: Internal Medicine

## 2016-08-11 VITALS — BP 133/87 | HR 54 | Temp 98.6°F | Resp 12 | Ht 73.0 in | Wt 245.0 lb

## 2016-08-11 DIAGNOSIS — D122 Benign neoplasm of ascending colon: Secondary | ICD-10-CM | POA: Diagnosis not present

## 2016-08-11 DIAGNOSIS — Z86010 Personal history of colonic polyps: Secondary | ICD-10-CM | POA: Diagnosis not present

## 2016-08-11 DIAGNOSIS — D123 Benign neoplasm of transverse colon: Secondary | ICD-10-CM

## 2016-08-11 DIAGNOSIS — K635 Polyp of colon: Secondary | ICD-10-CM

## 2016-08-11 DIAGNOSIS — D126 Benign neoplasm of colon, unspecified: Secondary | ICD-10-CM | POA: Diagnosis not present

## 2016-08-11 MED ORDER — SODIUM CHLORIDE 0.9 % IV SOLN: 500.0000 mL | INTRAVENOUS | Status: DC

## 2016-08-11 NOTE — Op Note (Signed)
[facility] Patient Name: Austin Trevino Procedure Date: 08/11/2016 2:49 PM MRN: [id number] Endoscopist: Gatha Mayer , MD Age: 50 Referring MD:  Date of Birth: June 24, 1966 Gender: Male Account #: [id number] Procedure:                Colonoscopy Indications:              Surveillance: Piecemeal removal of large sessile                            adenoma last colonoscopy (< 3 yrs) Medicines:                Propofol per Anesthesia, Monitored Anesthesia Care Procedure:                Pre-Anesthesia Assessment:                           - Prior to the procedure, a History and Physical                            was performed, and patient medications and                            allergies were reviewed. The patient's tolerance of                            previous anesthesia was also reviewed. The risks                            and benefits of the procedure and the sedation                            options and risks were discussed with the patient.                            All questions were answered, and informed consent                            was obtained. Prior Anticoagulants: The patient has                            taken no previous anticoagulant or antiplatelet                            agents. ASA Grade Assessment: II - A patient with                            mild systemic disease. After reviewing the risks                            and benefits, the patient was deemed in                            satisfactory condition to undergo the procedure.  After obtaining informed consent, the colonoscope                            was passed under direct vision. Throughout the                            procedure, the patient's blood pressure, pulse, and                            oxygen saturations were monitored continuously. The                            Model CF-HQ190L [id number]) scope was introduced   through the anus and advanced to the the cecum,                            identified by appendiceal orifice and ileocecal                            valve. The quality of the bowel preparation was                            excellent. The colonoscopy was performed without                            difficulty. The patient tolerated the procedure                            well. The bowel preparation used was Miralax. The                            ileocecal valve, appendiceal orifice, and rectum                            were photographed. Scope In: 3:09:27 PM Scope Out: 3:23:20 PM Scope Withdrawal Time: 0 hours 12 minutes 5 seconds  Total Procedure Duration: 0 hours 13 minutes 53 seconds  Findings:                 The perianal and digital rectal examinations were                            normal. Pertinent negatives include normal prostate                            (size, shape, and consistency).                           Two sessile polyps were found in the transverse                            colon. The polyps were 4 to 5 mm in size. These                            polyps were removed  with a cold snare. Resection                            and retrieval were complete. Verification of                            patient identification for the specimen was done.                            Estimated blood loss was minimal.                           A 2 mm polyp was found in the ascending colon. The                            polyp was sessile. The polyp was removed with a                            cold biopsy forceps. Resection and retrieval were                            complete. Verification of patient identification                            for the specimen was done. Estimated blood loss was                            minimal.                           A few diverticula were found in the sigmoid colon.                           The exam was otherwise without abnormality on                             direct and retroflexion views. Complications:            No immediate complications. Estimated blood loss:                            None. Estimated Blood Loss:     Estimated blood loss: none. Impression:               - Two 4 to 5 mm polyps in the transverse colon,                            removed with a cold snare. Resected and retrieved.                           - One 2 mm polyp in the ascending colon, removed                            with a cold biopsy forceps. Resected and retrieved.                           -  Mild diverticulosis in the sigmoid colon.                           - The examination was otherwise normal on direct                            and retroflexion views.                           - Personal history of colonic polyps. Recommendation:           - Patient has a contact number available for                            emergencies. The signs and symptoms of potential                            delayed complications were discussed with the                            patient. Return to normal activities tomorrow.                            Written discharge instructions were provided to the                            patient.                           - Resume previous diet.                           - Continue present medications.                           - Patient has a contact number available for                            emergencies. The signs and symptoms of potential                            delayed complications were discussed with the                            patient. Return to normal activities tomorrow.                            Written discharge instructions were provided to the                            patient.                           - Patient has a contact number available for  emergencies. The signs and symptoms of potential                            delayed complications were discussed  with the                            patient. Return to normal activities tomorrow.                            Written discharge instructions were provided to the                            patient.                           - Repeat colonoscopy is recommended. The                            colonoscopy date will be determined after pathology                            results from today's exam become available for                            review. Gatha Mayer, MD 08/11/2016 3:32:51 PM This report has been signed electronically.

## 2016-08-11 NOTE — Patient Instructions (Addendum)
   3 small polyps removed today. I will let you know pathology results and when to have another routine colonoscopy by mail. I anticipate recommending a return in 3 years - 2020  I appreciate the opportunity to care for you. Gatha Mayer, MD, FACG  YOU HAD AN ENDOSCOPIC PROCEDURE TODAY AT [facility]:   Refer to the procedure report that was given to you for any specific questions about what was found during the examination.  If the procedure report does not answer your questions, please call your gastroenterologist to clarify.  If you requested that your care partner not be given the details of your procedure findings, then the procedure report has been included in a sealed envelope for you to review at your convenience later.  YOU SHOULD EXPECT: Some feelings of bloating in the abdomen. Passage of more gas than usual.  Walking can help get rid of the air that was put into your GI tract during the procedure and reduce the bloating. If you had a lower endoscopy (such as a colonoscopy or flexible sigmoidoscopy) you may notice spotting of blood in your stool or on the toilet paper. If you underwent a bowel prep for your procedure, you may not have a normal bowel movement for a few days.  Please Note:  You might notice some irritation and congestion in your nose or some drainage.  This is from the oxygen used during your procedure.  There is no need for concern and it should clear up in a day or so.  SYMPTOMS TO REPORT IMMEDIATELY:   Following lower endoscopy (colonoscopy or flexible sigmoidoscopy):  Excessive amounts of blood in the stool  Significant tenderness or worsening of abdominal pains  Swelling of the abdomen that is new, acute  Fever of 100F or higher  For urgent or emergent issues, a gastroenterologist can be reached at any hour by calling [phone].   DIET:  We do recommend a small meal at first, but then you may proceed to your regular diet.  Drink  plenty of fluids but you should avoid alcoholic beverages for 24 hours.  ACTIVITY:  You should plan to take it easy for the rest of today and you should NOT DRIVE or use heavy machinery until tomorrow (because of the sedation medicines used during the test).    FOLLOW UP: Our staff will call the number listed on your records the next business day following your procedure to check on you and address any questions or concerns that you may have regarding the information given to you following your procedure. If we do not reach you, we will leave a message.  However, if you are feeling well and you are not experiencing any problems, there is no need to return our call.  We will assume that you have returned to your regular daily activities without incident.  If any biopsies were taken you will be contacted by phone or by letter within the next 1-3 weeks.  Please call us at [phone] if you have not heard about the biopsies in 3 weeks.    SIGNATURES/CONFIDENTIALITY: You and/or your care partner have signed paperwork which will be entered into your electronic medical record.  These signatures attest to the fact that that the information above on your After Visit Summary has been reviewed and is understood.  Full responsibility of the confidentiality of this discharge information lies with you and/or your care-partner.

## 2016-08-11 NOTE — Progress Notes (Signed)
Called to room to assist during endoscopic procedure.  Patient ID and intended procedure confirmed with present staff. Received instructions for my participation in the procedure from the performing physician.  

## 2016-08-11 NOTE — Progress Notes (Signed)
To Pacu  Awake and alert Report to RN 

## 2016-08-11 NOTE — Progress Notes (Signed)
Open abrasion noted to right hand,pt. Stated he hurt his hand on his chain saw the other day.

## 2016-08-12 ENCOUNTER — Telehealth: Payer: Self-pay

## 2016-08-12 NOTE — Telephone Encounter (Signed)
  Follow up Call-  Call back number 08/11/2016 02/10/2015  Post procedure Call Back phone  # [phone]  Permission to leave phone message Yes Yes     Patient questions:  Do you have a fever, pain , or abdominal swelling? No. Pain Score  0 *  Have you tolerated food without any problems? Yes.    Have you been able to return to your normal activities? Yes.    Do you have any questions about your discharge instructions: Diet   No. Medications  No. Follow up visit  No.  Do you have questions or concerns about your Care? No.  Actions: * If pain score is 4 or above: No action needed, pain <4.

## 2016-08-17 ENCOUNTER — Encounter: Payer: Self-pay | Admitting: Internal Medicine

## 2016-08-17 DIAGNOSIS — Z86010 Personal history of colonic polyps: Secondary | ICD-10-CM

## 2016-08-17 NOTE — Progress Notes (Signed)
Correction 3 yr recall as last colonoscopy 2016 and this one  to f/u laterally spreading 15 mm polyp

## 2016-08-17 NOTE — Progress Notes (Signed)
Small ssp/a and a hyperplastic right colon polyp (small) Recall 2022

## 2016-09-21 ENCOUNTER — Other Ambulatory Visit: Payer: Self-pay

## 2016-09-21 MED ORDER — PANTOPRAZOLE SODIUM 40 MG PO TBEC: 40.0000 mg | DELAYED_RELEASE_TABLET | Freq: Every day | ORAL | 6 refills | Status: DC

## 2016-09-21 NOTE — Telephone Encounter (Signed)
Refill sent.

## 2016-12-18 DIAGNOSIS — J111 Influenza due to unidentified influenza virus with other respiratory manifestations: Secondary | ICD-10-CM | POA: Diagnosis not present

## 2017-07-02 DIAGNOSIS — S61402A Unspecified open wound of left hand, initial encounter: Secondary | ICD-10-CM | POA: Diagnosis not present

## 2017-09-12 DIAGNOSIS — H11442 Conjunctival cysts, left eye: Secondary | ICD-10-CM | POA: Diagnosis not present

## 2017-09-25 DIAGNOSIS — S61412A Laceration without foreign body of left hand, initial encounter: Secondary | ICD-10-CM | POA: Diagnosis not present

## 2018-03-28 ENCOUNTER — Encounter: Payer: Self-pay | Admitting: Family Medicine

## 2018-03-28 ENCOUNTER — Telehealth: Payer: Self-pay | Admitting: Family Medicine

## 2018-03-28 ENCOUNTER — Ambulatory Visit: Payer: BLUE CROSS/BLUE SHIELD | Admitting: Family Medicine

## 2018-03-28 VITALS — BP 166/99 | HR 68 | Temp 98.6°F | Resp 20 | Ht 73.0 in | Wt 239.0 lb

## 2018-03-28 DIAGNOSIS — S30861A Insect bite (nonvenomous) of abdominal wall, initial encounter: Secondary | ICD-10-CM | POA: Diagnosis not present

## 2018-03-28 DIAGNOSIS — W57XXXA Bitten or stung by nonvenomous insect and other nonvenomous arthropods, initial encounter: Secondary | ICD-10-CM | POA: Diagnosis not present

## 2018-03-28 MED ORDER — DOXYCYCLINE HYCLATE 100 MG PO TABS: 200.0000 mg | ORAL_TABLET | Freq: Once | ORAL | 0 refills | Status: AC

## 2018-03-28 NOTE — Progress Notes (Signed)
Austin Trevino , 17-Apr-1966, 52 y.o., male MRN: [id number] Patient Care Team    Relationship Specialty Notifications Start End  Ma Hillock, DO PCP - General Family Medicine  06/29/15   Kathee Delton, MD Consulting Physician Pulmonary Disease  03/25/15     Chief Complaint  Patient presents with  . Insect Bite    tick found yesterday,     Subjective: Pt presents for an OV with complaints of tick bite of 1 day duration.  Associated symptoms include nothing. He reports he believes it attached about 24 hour prior. He tried to remove it, but he only was able to remove part of the insect. He denies fever, chills, nausea, rash, fatigue or headache.   Depression screen PHQ 2/9 03/28/2018  Decreased Interest 0  Down, Depressed, Hopeless 0  PHQ - 2 Score 0    No Known Allergies Social History   Tobacco Use  . Smoking status: Never Smoker  . Smokeless tobacco: Never Used  Substance Use Topics  . Alcohol use: No    Alcohol/week: 0.0 oz    Comment: socially but rare   Past Medical History:  Diagnosis Date  . GERD (gastroesophageal reflux disease)   . Hx of colonic polyps - adenomas and ssp 02/18/2015  . Hypertension    treated "years ago" but he did some TLC/lost wt and was able to get off meds.  . Obesity, Class I, BMI 30-34.9   . OSA (obstructive sleep apnea) 01/2015   Suspected: eval by Dr. Gwenette Greet 02/2015, sleep study recommended.   Past Surgical History:  Procedure Laterality Date  . COLONOSCOPY W/ POLYPECTOMY    . NO PAST SURGERIES     Family History  Problem Relation Age of Onset  . Arthritis Mother   . Hypertension Mother   . Colon polyps Mother   . Hypertension Father   . Myelodysplastic syndrome Father   . Cancer Father   . Neuropathy Sister        amyloidosis  . Prostate cancer Paternal Grandfather   . Asthma Paternal Grandmother   . Colon cancer Neg Hx    Allergies as of 03/28/2018   No Known Allergies     Medication List        Accurate as of 03/28/18   5:51 PM. Always use your most recent med list.          doxycycline 100 MG tablet Commonly known as:  VIBRA-TABS Take 2 tablets (200 mg total) by mouth once for 1 dose.   multivitamin tablet Take 1 tablet by mouth daily.   pantoprazole 40 MG tablet Commonly known as:  PROTONIX Take 1 tablet (40 mg total) by mouth daily.       All past medical history, surgical history, allergies, family history, immunizations andmedications were updated in the EMR today and reviewed under the history and medication portions of their EMR.     ROS: Negative, with the exception of above mentioned in HPI   Objective:  BP (!) 166/99 (BP Location: Left Arm, Patient Position: Sitting, Cuff Size: Large)   Pulse 68   Temp 98.6 F (37 C)   Resp 20   Ht 6\' 1"  (1.854 m)   Wt 239 lb (108.4 kg)   SpO2 96%   BMI 31.53 kg/m  Body mass index is 31.53 kg/m. Gen: Afebrile. No acute distress. Nontoxic in appearance, well developed, well nourished.  Skin: no rashes, purpura or petechiae. Small area of insect bite left flank, with  small amount of tick remaining when visualized under magnification. Removed today with 18g needle without difficulty.  Neuro: Normal gait. PERLA. EOMi. Alert. Oriented x3  No exam data present No results found. No results found for this or any previous visit (from the past 24 hour(s)).  Assessment/Plan: Austin Trevino is a 52 y.o. male present for OV for  Tick bite of abdomen, initial encounter - removed small piece of insect appreciated under magnification w/ 18 g needle tip. Area cleaned, abx ointment and bandaid provided.  - Monitor for redness, swelling, rash, headache, fever etc. .  - prophylactic doxycyline 200 mg QD.  - F/U PRN    Elevated BP: -Patient will be asked to return for a nurse visit within the next 1 to 2 weeks to have blood pressure rechecked.  If abnormal will be need to be seen by the provider.   Reviewed expectations re: course of current medical  issues.  Discussed self-management of symptoms.  Outlined signs and symptoms indicating need for more acute intervention.  Patient verbalized understanding and all questions were answered.  Patient received an After-Visit Summary.    No orders of the defined types were placed in this encounter.    Note is dictated utilizing voice recognition software. Although note has been proof read prior to signing, occasional typographical errors still can be missed. If any questions arise, please do not hesitate to call for verification.   electronically signed by:  Howard Pouch, DO  [place]

## 2018-03-28 NOTE — Telephone Encounter (Signed)
Please call patient: He left yesterday prior to having repeat blood pressure in the office.  His first blood pressure was rather elevated.  He was nervous.  I would recommend he schedule for a nurse visit in the next 1-2 weeks to have blood pressure rechecked.  If abnormal again we will need to put him on for provider appointment to discuss blood pressure treatment.

## 2018-03-28 NOTE — Patient Instructions (Signed)
Take doxycycline 200 mg after eating today. This is the prophylaxis.    Tick Bite Information, Adult Ticks are insects that can bite. Most ticks live in shrubs and grassy areas. They climb onto people and animals that go by. Then they bite. Some ticks carry germs that can make you sick. How can I prevent tick bites?  Use an insect repellent that has 20% or higher of the ingredients DEET, picaridin, or IR3535. Put this insect repellent on: ? Bare skin. ? The tops of your boots. ? Your pant legs. ? The ends of your sleeves.  If you use an insect repellent that has the ingredient permethrin, make sure to follow the instructions on the bottle. Treat the following: ? Clothing. ? Supplies. ? Boots. ? Tents.  Wear long sleeves, long pants, and light colors.  Tuck your pant legs into your socks.  Stay in the middle of the trail.  Try not to walk through long grass.  Before going inside your house, check your clothes, hair, and skin for ticks. Make sure to check your head, neck, armpits, waist, groin, and joint areas.  Check for ticks every day.  When you come indoors: ? Wash your clothes right away. ? Shower right away. ? Dry your clothes in a dryer on high heat for 60 minutes or more. What is the right way to remove a tick? Remove a tick from your skin as soon as possible.  To remove a tick that is crawling on your skin: ? Go outdoors and brush the tick off. ? Use tape or a lint roller.  To remove a tick that is biting: ? Wash your hands. ? If you have latex gloves, put them on. ? Use tweezers, curved forceps, or a tick-removal tool to grasp the tick. Grasp the tick as close to your skin and as close to the tick's head as possible. ? Gently pull up until the tick lets go.  Try to keep the tick's head attached to its body.  Do not twist or jerk the tick.  Do not squeeze or crush the tick.  Do not try to remove a tick with heat, alcohol, petroleum jelly, or fingernail  polish. How should I get rid of a tick? Here are some ways to get rid of a tick that is alive:  Place the tick in rubbing alcohol.  Place the tick in a bag or container you can close tightly.  Wrap the tick tightly in tape.  Flush the tick down the toilet.  Contact a doctor if:  You have symptoms of a disease, such as: ? Pain in a muscle, joint, or bone. ? Trouble walking or moving your legs. ? Numbness in your legs. ? Inability to move (paralysis). ? A red rash that makes a circle (bull's-eye rash). ? Redness and swelling where the tick bit you. ? A fever. ? Throwing up (vomiting) over and over. ? Diarrhea. ? Weight loss. ? Tender and swollen lymph glands. ? Shortness of breath. ? Cough. ? Belly pain (abdominal pain). ? Headache. ? Being more tired than normal. ? A change in how alert (conscious) you are. ? Confusion. Get help right away if:  You cannot remove a tick.  A part of a tick breaks off and gets stuck in your skin.  You are feeling worse. Summary  Ticks may carry germs that can make you sick.  To prevent tick bites, wear long sleeves, long pants, and light colors. Use insect repellent. Follow the  instructions on the bottle.  If the tick is biting, do not try to remove it with heat, alcohol, petroleum jelly, or fingernail polish.  Use tweezers, curved forceps, or a tick-removal tool to grasp the tick. Gently pull up until the tick lets go. Do not twist or jerk the tick. Do not squeeze or crush the tick.  If you have symptoms, contact a doctor. This information is not intended to replace advice given to you by your health care provider. Make sure you discuss any questions you have with your health care provider. Document Released: 02/08/2010 Document Revised: 02/24/2017 Document Reviewed: 02/24/2017 Elsevier Interactive Patient Education  2018 [employer].

## 2018-03-29 NOTE — Telephone Encounter (Signed)
Spoke with patient he will be out of town the next couple of weeks. Scheduled nurse visit for 04/20/18 at patient request.

## 2018-04-09 ENCOUNTER — Ambulatory Visit: Payer: Self-pay

## 2018-04-09 NOTE — Telephone Encounter (Signed)
noted 

## 2018-04-09 NOTE — Telephone Encounter (Signed)
Patient called in with c/o "UTI symptoms." He says "for the past few days, I have been getting up urinating more frequently during the night. My urine is a little dark and has a smell, but I wouldn't say foul smelling. I also noticed some pain to my groin area, maybe a 3 and constant. I am out of town and want to know if an antibiotic can be called in." I advised that the provider would need to see him in the office and he would need to give a urine sample, before any treatment. I advised him to go to the ED or UC and I also gave him the number to set up [facility] and explained E-Visit. He says "I won't go to the ED, but the E-Visit will work better." I advised that this would be sent to the provider for review. Care advice given, he verbalized understanding.  Pharmacy: [facility] [place] [id number], [facility] # [phone]  Reason for Disposition . Urinating more frequently than usual (i.e., frequency)  Answer Assessment - Initial Assessment Questions 1. SYMPTOM: "What's the main symptom you're concerned about?" (e.g., frequency, incontinence)     Frequency 2. ONSET: "When did the  ________  start?"     A few days ago 3. PAIN: "Is there any pain?" If so, ask: "How bad is it?" (Scale: 1-10; mild, moderate, severe)     Yes to groin area, maybe a 3 4. CAUSE: "What do you think is causing the symptoms?"     Possible UTI 5. OTHER SYMPTOMS: "Do you have any other symptoms?" (e.g., fever, flank pain, blood in urine, pain with urination)    Pain with urination 6. PREGNANCY: "Is there any chance you are pregnant?" "When was your last menstrual period?"     N/A  Protocols used: URINARY [facility]

## 2018-04-20 ENCOUNTER — Other Ambulatory Visit: Payer: Self-pay

## 2018-04-20 ENCOUNTER — Ambulatory Visit (INDEPENDENT_AMBULATORY_CARE_PROVIDER_SITE_OTHER): Payer: BLUE CROSS/BLUE SHIELD

## 2018-04-20 VITALS — BP 138/82 | HR 69

## 2018-04-20 DIAGNOSIS — R030 Elevated blood-pressure reading, without diagnosis of hypertension: Secondary | ICD-10-CM | POA: Diagnosis not present

## 2018-04-20 MED ORDER — PANTOPRAZOLE SODIUM 40 MG PO TBEC: 40.0000 mg | DELAYED_RELEASE_TABLET | Freq: Every day | ORAL | 0 refills | Status: DC

## 2018-04-20 NOTE — Progress Notes (Signed)
Patient notified and verbalized understanding. Patient stated that he will call back next week to schedule a follow up appointment.

## 2018-04-20 NOTE — Addendum Note (Signed)
Addended by: Howard Pouch A on: 04/20/2018 10:31 AM   Modules accepted: Level of Service

## 2018-04-20 NOTE — Progress Notes (Addendum)
Austin Trevino is a 52 y.o. male presents to the office today for Blood pressure recheck secondary to elevated BP in office. Blood pressure: 138/82. Blood pressure medication: none Blood pressure was taken in the left arm after patient rested for 10 minutes.  BP 138/82 (BP Location: Left Arm, Patient Position: Sitting, Cuff Size: Large)   Pulse 69  Patient stated that he had recently gotten a good deal on some country ham and has been eating more of this than usual.  Starla Link CMA   Please inform patient the following information: His BP is borderline on recheck. Recommend low sodium diet, drink plenty of water to maintain hydration, and routine exercise.  We will continue to monitor routinely on OV and start medication if it increases.

## 2018-05-17 ENCOUNTER — Other Ambulatory Visit: Payer: Self-pay | Admitting: *Deleted

## 2018-05-17 MED ORDER — PANTOPRAZOLE SODIUM 40 MG PO TBEC: 40.0000 mg | DELAYED_RELEASE_TABLET | Freq: Every day | ORAL | 0 refills | Status: DC

## 2018-05-26 ENCOUNTER — Other Ambulatory Visit: Payer: Self-pay

## 2018-05-26 ENCOUNTER — Emergency Department (HOSPITAL_BASED_OUTPATIENT_CLINIC_OR_DEPARTMENT_OTHER)
Admission: EM | Admit: 2018-05-26 | Discharge: 2018-05-26 | Disposition: A | Discharge status: Home / Self Care | Payer: BLUE CROSS/BLUE SHIELD | Attending: Emergency Medicine | Admitting: Emergency Medicine

## 2018-05-26 ENCOUNTER — Encounter (HOSPITAL_BASED_OUTPATIENT_CLINIC_OR_DEPARTMENT_OTHER): Payer: Self-pay | Admitting: Emergency Medicine

## 2018-05-26 ENCOUNTER — Emergency Department (HOSPITAL_BASED_OUTPATIENT_CLINIC_OR_DEPARTMENT_OTHER): Payer: BLUE CROSS/BLUE SHIELD

## 2018-05-26 DIAGNOSIS — N451 Epididymitis: Secondary | ICD-10-CM | POA: Diagnosis not present

## 2018-05-26 DIAGNOSIS — Z79899 Other long term (current) drug therapy: Secondary | ICD-10-CM | POA: Insufficient documentation

## 2018-05-26 DIAGNOSIS — R52 Pain, unspecified: Secondary | ICD-10-CM

## 2018-05-26 DIAGNOSIS — I10 Essential (primary) hypertension: Secondary | ICD-10-CM | POA: Diagnosis not present

## 2018-05-26 DIAGNOSIS — N50812 Left testicular pain: Secondary | ICD-10-CM | POA: Diagnosis not present

## 2018-05-26 DIAGNOSIS — N503 Cyst of epididymis: Secondary | ICD-10-CM | POA: Diagnosis not present

## 2018-05-26 MED ORDER — LEVOFLOXACIN 500 MG PO TABS
500.0000 mg | ORAL_TABLET | Freq: Once | ORAL | Status: AC
  Administered 2018-05-26: 500 mg via ORAL
  Filled 2018-05-26: qty 1

## 2018-05-26 MED ORDER — LEVOFLOXACIN 500 MG PO TABS: 500.0000 mg | ORAL_TABLET | Freq: Every day | ORAL | 0 refills | Status: AC

## 2018-05-26 NOTE — ED Provider Notes (Signed)
[place] EMERGENCY DEPARTMENT Provider Note   CSN: [id number] Arrival date & time: 05/26/18  1711     History   Chief Complaint Chief Complaint  Patient presents with  . Testicle Pain    HPI Austin Trevino is a 52 y.o. male.  51 year old male with past medical history including OSA, hypertension, GERD who presents with left testicle pain.  Approximately 3 days ago he began having left testicle pain that woke him from sleep.  It seemed to subside and then occasionally flared up again.  Over the past 24 hours, he has noticed some swelling of his scrotum and today the pain became more severe which is what prompted him to be evaluated.  He denies any injury, urinary problems, penile discharge, fevers, abdominal pain, or vomiting.  He noted some relief with compression shorts and elevation.  He has taken Aleve or ibuprofen occasionally.  He is sexually active with one partner.  The history is provided by the patient.  Testicle Pain     Past Medical History:  Diagnosis Date  . GERD (gastroesophageal reflux disease)   . Hx of colonic polyps - adenomas and ssp 02/18/2015  . Hypertension    treated "years ago" but he did some TLC/lost wt and was able to get off meds.  . Obesity, Class I, BMI 30-34.9   . OSA (obstructive sleep apnea) 01/2015   Suspected: eval by Dr. Gwenette Greet 02/2015, sleep study recommended.    Patient Active Problem List   Diagnosis Date Noted  . Gastroenteritis 01/20/2016  . OSA (obstructive sleep apnea) 03/16/2015  . Hx of colonic polyps - adenomas and ssp 02/18/2015    Past Surgical History:  Procedure Laterality Date  . COLONOSCOPY W/ POLYPECTOMY    . NO PAST SURGERIES          Home Medications    Prior to Admission medications   Medication Sig Start Date End Date Taking? Authorizing Provider  levofloxacin (LEVAQUIN) 500 MG tablet Take 1 tablet (500 mg total) by mouth daily for 10 days. 05/26/18 06/05/18  Taira Knabe, Wenda Overland, MD  Multiple  Vitamin (MULTIVITAMIN) tablet Take 1 tablet by mouth daily.    [provider]  pantoprazole (PROTONIX) 40 MG tablet Take 1 tablet (40 mg total) by mouth daily. Will need follow up visit for further refills. 05/17/18   Ma Hillock, DO    Family History Family History  Problem Relation Age of Onset  . Arthritis Mother   . Hypertension Mother   . Colon polyps Mother   . Hypertension Father   . Myelodysplastic syndrome Father   . Cancer Father   . Neuropathy Sister        amyloidosis  . Prostate cancer Paternal Grandfather   . Asthma Paternal Grandmother   . Colon cancer Neg Hx     Social History Social History   Tobacco Use  . Smoking status: Never Smoker  . Smokeless tobacco: Never Used  Substance Use Topics  . Alcohol use: No    Alcohol/week: 0.0 oz    Comment: socially but rare  . Drug use: No     Allergies   Patient has no known allergies.   Review of Systems Review of Systems  Genitourinary: Positive for testicular pain.   All other systems reviewed and are negative except that which was mentioned in HPI   Physical Exam Updated Vital Signs BP (!) 158/105 (BP Location: Right Arm)   Pulse 64   Temp 98.4 F (36.9 C) (  Oral)   Resp 20   Ht 6' (1.829 m)   Wt 108.9 kg (240 lb)   SpO2 97%   BMI 32.55 kg/m   Physical Exam  Constitutional: He is oriented to person, place, and time. He appears well-developed and well-nourished. No distress.  HENT:  Head: Normocephalic and atraumatic.  Moist mucous membranes  Eyes: Conjunctivae are normal.  Neck: Neck supple.  Cardiovascular: Normal rate, regular rhythm and normal heart sounds.  No murmur heard. Pulmonary/Chest: Effort normal and breath sounds normal.  Abdominal: Soft. Bowel sounds are normal. He exhibits no distension. There is no tenderness.  Genitourinary: Penis normal.  Genitourinary Comments: Mild edema L scrotum w/ mild tenderness superior pole of L testicle, no skin changes    Musculoskeletal: He exhibits no edema.  Neurological: He is alert and oriented to person, place, and time.  Fluent speech  Skin: Skin is warm and dry.  Psychiatric: He has a normal mood and affect. Judgment normal.  Nursing note and vitals reviewed.  Chaperone was present during exam.   ED Treatments / Results  Labs (all labs ordered are listed, but only abnormal results are displayed) Labs Reviewed - No data to display  EKG None  Radiology US Scrotum W/doppler  Result Date: 05/26/2018 CLINICAL DATA:  Left sticking the pain and swelling x2 days without known trauma. EXAM: SCROTAL ULTRASOUND DOPPLER ULTRASOUND OF THE TESTICLES TECHNIQUE: Complete ultrasound examination of the testicles, epididymis, and other scrotal structures was performed. Color and spectral Doppler ultrasound were also utilized to evaluate blood flow to the testicles. COMPARISON:  None. FINDINGS: Right testicle Measurements: 5.7 x 3.1 x 3.9 cm. No mass or microlithiasis visualized. Left testicle Measurements: 5.2 x 2.4 x 4.1 cm. No mass or microlithiasis visualized. Right epididymis: Normal in size with a few anechoic epididymal cyst noted near the head and body. The largest approximately 1 cm. Left epididymis:  Hyperemic and mildly enlarged. Hydrocele:  None visualized. Varicocele:  None visualized. Pulsed Doppler interrogation of both testes demonstrates normal low resistance arterial and venous waveforms bilaterally. IMPRESSION: 1. Hyperemic enlargement of the left epididymis suspicious for epididymitis. No torsion of either testicle or intratesticular mass. 2. Right-sided epididymal cysts the largest approximately 1 cm. Electronically Signed   By: Ashley Royalty M.D.   On: 05/26/2018 19:06    Procedures Procedures (including critical care time)  Medications Ordered in ED Medications  levofloxacin (LEVAQUIN) tablet 500 mg (500 mg Oral Given 05/26/18 1954)     Initial Impression / Assessment and Plan / ED Course   I have reviewed the triage vital signs and the nursing notes.  Pertinent imaging results that were available during my care of the patient were reviewed by me and considered in my medical decision making (see chart for details).    Pt comfortable on exam, afebrile. US shows left epididymitis, no evidence of torsion or mass.  Discussed supportive measures including elevation, tight underwear, ice, NSAID course as well as antibiotic course.  Provided with urology follow-up for any ongoing problems.  Extensively reviewed return precautions.  He voiced understanding.  Final Clinical Impressions(s) / ED Diagnoses   Final diagnoses:  Left epididymitis    ED Discharge Orders        Ordered    levofloxacin (LEVAQUIN) 500 MG tablet  Daily     05/26/18 1938       Eliya Geiman, Wenda Overland, MD 05/27/18 0030

## 2018-05-26 NOTE — ED Notes (Signed)
ED Provider at bedside. 

## 2018-05-26 NOTE — ED Notes (Signed)
Patient transported to Ultrasound 

## 2018-05-26 NOTE — ED Notes (Signed)
Pt reports pain, left testicle, since Wednesday night. Reports he has been on vacation and been in ocean but denies known injury.

## 2018-05-26 NOTE — ED Notes (Signed)
Pt given Rx x 1 for levaquin

## 2018-05-26 NOTE — ED Triage Notes (Signed)
L testicle pain and swelling x 2 days. Denies dysuria, discharge.

## 2018-07-02 ENCOUNTER — Other Ambulatory Visit: Payer: Self-pay | Admitting: *Deleted

## 2018-07-02 MED ORDER — PANTOPRAZOLE SODIUM 40 MG PO TBEC: 40.0000 mg | DELAYED_RELEASE_TABLET | Freq: Every day | ORAL | 0 refills | Status: DC

## 2018-07-02 NOTE — Telephone Encounter (Signed)
14 day supply of protonix authorized. Patient needs office visit prior to any additional refills. Left message for patient to schedule an appt.

## 2019-01-01 ENCOUNTER — Other Ambulatory Visit: Payer: Self-pay | Admitting: Family Medicine

## 2019-01-01 ENCOUNTER — Encounter: Payer: Self-pay | Admitting: Family Medicine

## 2019-01-01 ENCOUNTER — Ambulatory Visit (INDEPENDENT_AMBULATORY_CARE_PROVIDER_SITE_OTHER): Payer: BLUE CROSS/BLUE SHIELD | Admitting: Family Medicine

## 2019-01-01 VITALS — BP 184/116 | HR 65 | Temp 98.5°F | Resp 16 | Ht 73.0 in | Wt 236.2 lb

## 2019-01-01 DIAGNOSIS — R10814 Left lower quadrant abdominal tenderness: Secondary | ICD-10-CM

## 2019-01-01 DIAGNOSIS — I10 Essential (primary) hypertension: Secondary | ICD-10-CM

## 2019-01-01 LAB — TSH: TSH: 1.6 u[IU]/mL (ref 0.35–4.50)

## 2019-01-01 LAB — BASIC METABOLIC PANEL
BUN: 13 mg/dL (ref 6–23)
CALCIUM: 9.5 mg/dL (ref 8.4–10.5)
CO2: 30 mEq/L (ref 19–32)
Chloride: 103 mEq/L (ref 96–112)
Creatinine, Ser: 1.06 mg/dL (ref 0.40–1.50)
GFR: 72.66 mL/min (ref >60.00)
GLUCOSE: 104 mg/dL — AB (ref 70–99)
Potassium: 4.3 mEq/L (ref 3.5–5.1)
Sodium: 139 mEq/L (ref 135–145)

## 2019-01-01 LAB — CBC
HCT: 47.4 % (ref 39.0–52.0)
Hemoglobin: 16.3 g/dL (ref 13.0–17.0)
MCHC: 34.4 g/dL (ref 30.0–36.0)
MCV: 91.5 fl (ref 78.0–100.0)
Platelets: 211 10*3/uL (ref 150.0–400.0)
RBC: 5.18 Mil/uL (ref 4.22–5.81)
RDW: 13.6 % (ref 11.5–15.5)
WBC: 8.2 10*3/uL (ref 4.0–10.5)

## 2019-01-01 MED ORDER — LISINOPRIL 10 MG PO TABS: 10.0000 mg | ORAL_TABLET | Freq: Every day | ORAL | 0 refills | Status: DC

## 2019-01-01 NOTE — Progress Notes (Signed)
Austin Trevino , [birth year], 53 y.o., male MRN: [id number] Patient Care Team    Relationship Specialty Notifications Start End  Ma Hillock, DO PCP - General Family Medicine  06/29/15   Kathee Delton, MD Consulting Physician Pulmonary Disease  03/25/15     Chief Complaint  Patient presents with  . Hip Pain    Left hip pain/ache  x1 month.      Subjective: Pt presents for an OV with complaints of left groin pain of 1 month duration.  Associated symptoms include pain is a deep ache. Can radiate to his back. Points to Left lower quadrant/pelvis. He denies fever, chills, nausea, vomit, pain with ejaculation, swelling or pain of scrotum.  No prior injury or surgery. No increase in activity prior to onset. Pain is worse with bowel movement, bearing down, eating, straining, bending over and turning over in bed can wake him with pain.  Pt has tried NSAIDS to ease their symptoms. He denies BPR. Last BM today.   Depression screen PHQ 2/9 03/28/2018  Decreased Interest 0  Down, Depressed, Hopeless 0  PHQ - 2 Score 0    No Known Allergies Social History   Social History Narrative   Divorced, 3 sons in middle school/HS [employer]).   Educ: BS management from [employer].  Orig from [place].   Occupation: Nurse, learning disability for [employer].   No tob, alcohol intake occasional/social.   Past Medical History:  Diagnosis Date  . GERD (gastroesophageal reflux disease)   . Hx of colonic polyps - adenomas and ssp 02/18/2015  . Hypertension    treated "years ago" but he did some TLC/lost wt and was able to get off meds.  . Obesity, Class I, BMI 30-34.9   . OSA (obstructive sleep apnea) 01/2015   Suspected: eval by Dr. Gwenette Greet 02/2015, sleep study recommended.   Past Surgical History:  Procedure Laterality Date  . COLONOSCOPY W/ POLYPECTOMY    . NO PAST SURGERIES     Family History  Problem Relation Age of Onset  . Arthritis Mother   . Hypertension Mother   . Colon polyps Mother   .  Hypertension Father   . Myelodysplastic syndrome Father   . Cancer Father   . Neuropathy Sister        amyloidosis  . Prostate cancer Paternal Grandfather   . Asthma Paternal Grandmother   . Colon cancer Neg Hx    Allergies as of 01/01/2019   No Known Allergies     Medication List       Accurate as of January 01, 2019  1:47 PM. Always use your most recent med list.        multivitamin tablet Take 1 tablet by mouth daily.   pantoprazole 40 MG tablet Commonly known as:  PROTONIX Take 1 tablet (40 mg total) by mouth daily. Will need follow up visit for further refills.       All past medical history, surgical history, allergies, family history, immunizations andmedications were updated in the EMR today and reviewed under the history and medication portions of their EMR.     ROS: Negative, with the exception of above mentioned in HPI   Objective:  BP (!) 184/116 (BP Location: Right Arm, Patient Position: Sitting, Cuff Size: Normal)   Pulse 65   Temp 98.5 F (36.9 C) (Oral)   Resp 16   Ht 6\' 1"  (1.854 m)   Wt 236 lb 4 oz (107.2 kg)   SpO2 98%  BMI 31.17 kg/m  Body mass index is 31.17 kg/m. Gen: Afebrile. No acute distress. Nontoxic in appearance, well developed, well nourished.  HENT: AT. [place].MMM Eyes:Pupils Equal Round Reactive to light, Extraocular movements intact,  Conjunctiva without redness, discharge or icterus. CV: RRR no murmur, no edema Chest: CTAB, no wheeze or crackles. Good air movement, normal resp effort.  Abd: Soft. obese. ND. No TTP. BS present. no Masses palpated. No rebound or guarding. Tissue texture change present LLQ-pelvic area left of suprapubic area- medial and inferior to ASIS. MSK: MS 5/5 BLE, discomfort with hip flexion in pelvis, not at flexor. NEG FABRE. NEG SLR. NV intact distally.  Skin: no rashes, purpura or petechiae. No skin changes of abdomen.  Neuro:  Normal gait. PERLA. EOMi. Alert. Oriented x3  No exam data present No results  found. No results found for this or any previous visit (from the past 24 hour(s)).  Assessment/Plan: Yovanny Coats is a 53 y.o. male present for OV for  Essential hypertension - consistently elevated BP, pt now willing to start med. Start lisinopril 10 mg QD. Follow 1-2 days after [place]. Will taper lisinopril.  - lisinopril (PRINIVIL,ZESTRIL) 10 MG tablet; Take 1 tablet (10 mg total) by mouth daily.  Dispense: 90 tablet; Refill: 0 - Basic Metabolic Panel (BMET) - CBC - TSH - f/u within week.   Abdominal tenderness of left lower quadrant, rebound tenderness presence not specified - concern for inguinal hernia with exam and HPI. Pain now > 4 weeks and worse with bearing down. No obvious mass on exam, although not an easy exam on pt. Mild tissue texture change over area of concern. All discomfort located in the pelvis, not at hip flexor.  - stool softener use- formed soft stool goal. Increase Water consumption. No straining or lifting.  - Basic Metabolic Panel (BMET) - CBC - US Pelvis Limited; Future - [place] Art/Ven Flow Abd Pelv Doppler; Future - can continue nsaids for comfort.  - US pelvis and doppler order to further evaluate.  Emergent precautions discussed in detail.  - f/u dependent upon image result.    Reviewed expectations re: course of current medical issues.  Discussed self-management of symptoms.  Outlined signs and symptoms indicating need for more acute intervention.  Patient verbalized understanding and all questions were answered.  Patient received an After-Visit Summary.    No orders of the defined types were placed in this encounter.    Note is dictated utilizing voice recognition software. Although note has been proof read prior to signing, occasional typographical errors still can be missed. If any questions arise, please do not hesitate to call for verification.   electronically signed by:  Howard Pouch, DO  [place]

## 2019-01-01 NOTE — Patient Instructions (Addendum)
Start lisinopril 10 mg a day  They will call you to schedule your Ultrasound.  In the meantime- use a stool softener- keep stools formed but soft.  No lifting.   Inguinal Hernia, Adult An inguinal hernia is when fat or your intestines push through a weak spot in a muscle where your leg meets your lower belly (groin). This causes a rounded lump (bulge). This kind of hernia could also be:  In your scrotum, if you are male.  In folds of skin around your vagina, if you are male. There are three types of inguinal hernias. These include:  Hernias that can be pushed back into the belly (are reducible). This type rarely causes pain.  Hernias that cannot be pushed back into the belly (are incarcerated).  Hernias that cannot be pushed back into the belly and lose their blood supply (are strangulated). This type needs emergency surgery. If you do not have symptoms, you may not need treatment. If you have symptoms or a large hernia, you may need surgery. Follow these instructions at home: Lifestyle  Do these things if told by your doctor so you do not have trouble pooping (constipation): ? Drink enough fluid to keep your pee (urine) pale yellow. ? Eat foods that have a lot of fiber. These include fresh fruits and vegetables, whole grains, and beans. ? Limit foods that are high in fat and processed sugars. These include foods that are fried or sweet. ? Take medicine for trouble pooping.  Avoid lifting heavy objects.  Avoid standing for long amounts of time.  Do not use any products that contain nicotine or tobacco. These include cigarettes and e-cigarettes. If you need help quitting, ask your doctor.  Stay at a healthy weight. General instructions  You may try to push your hernia in by very gently pressing on it when you are lying down. Do not try to force the bulge back in if it will not push in easily.  Watch your hernia for any changes in shape, size, or color. Tell your doctor if you  see any changes.  Take over-the-counter and prescription medicines only as told by your doctor.  Keep all follow-up visits as told by your doctor. This is important. Contact a doctor if:  You have a fever.  You have new symptoms.  Your symptoms get worse. Get help right away if:  The area where your leg meets your lower belly has: ? Pain that gets worse suddenly. ? A bulge that gets bigger suddenly, and it does not get smaller after that. ? A bulge that turns red or purple. ? A bulge that is painful when you touch it.  You are a man, and your scrotum: ? Suddenly feels painful. ? Suddenly changes in size.  You cannot push the hernia in by very gently pressing on it when you are lying down. Do not try to force the bulge back in if it will not push in easily.  You feel sick to your stomach (nauseous), and that feeling does not go away.  You throw up (vomit), and that keeps happening.  You have a fast heartbeat.  You cannot poop (have a bowel movement) or pass gas. These symptoms may be an emergency. Do not wait to see if the symptoms will go away. Get medical help right away. Call your local emergency services (911 in the [place] Summary  An inguinal hernia is when fat or your intestines push through a weak spot in a muscle where your  leg meets your lower belly (groin). This causes a rounded lump (bulge).  If you do not have symptoms, you may not need treatment. If you have symptoms or a large hernia, you may need surgery.  Avoid lifting heavy objects. Also avoid standing for long amounts of time.  Do not try to force the bulge back in if it will not push in easily. This information is not intended to replace advice given to you by your health care provider. Make sure you discuss any questions you have with your health care provider. Document Released: 12/15/2006 Document Revised: 12/16/2017 Document Reviewed: 08/16/2017 Elsevier Interactive Patient Education  2019 [employer].

## 2019-01-02 ENCOUNTER — Ambulatory Visit (INDEPENDENT_AMBULATORY_CARE_PROVIDER_SITE_OTHER): Payer: BLUE CROSS/BLUE SHIELD

## 2019-01-02 DIAGNOSIS — R102 Pelvic and perineal pain: Secondary | ICD-10-CM | POA: Diagnosis not present

## 2019-01-02 DIAGNOSIS — R10814 Left lower quadrant abdominal tenderness: Secondary | ICD-10-CM | POA: Diagnosis not present

## 2019-01-03 ENCOUNTER — Other Ambulatory Visit: Payer: BLUE CROSS/BLUE SHIELD

## 2019-01-03 ENCOUNTER — Telehealth: Payer: Self-pay | Admitting: Family Medicine

## 2019-01-03 MED ORDER — DICLOFENAC SODIUM 75 MG PO TBEC: 75.0000 mg | DELAYED_RELEASE_TABLET | Freq: Two times a day (BID) | ORAL | 0 refills | Status: DC

## 2019-01-03 NOTE — Telephone Encounter (Signed)
Please inform patient the following information: His [place] did not show evidence of a hernia. Which is great news. However, it does not give [place] an answer on why he is having discomfort.  - I would suggest treating as musculoskeletal strain with NSAIDs- I have called in  A med called Voltaren to be taken BID with food. This will help with discomfort and decrease inflammation.  - Keep appt for Monday- we will recheck his BP and see if he has any improvement on NSAIDS. Continue stool softener and no straining instructions we discussed yesterday.

## 2019-01-03 NOTE — Telephone Encounter (Signed)
Pt notified of results

## 2019-01-07 ENCOUNTER — Ambulatory Visit (INDEPENDENT_AMBULATORY_CARE_PROVIDER_SITE_OTHER): Payer: BLUE CROSS/BLUE SHIELD | Admitting: Family Medicine

## 2019-01-07 ENCOUNTER — Encounter: Payer: Self-pay | Admitting: Family Medicine

## 2019-01-07 VITALS — BP 150/79 | HR 53 | Temp 98.5°F | Resp 16 | Ht 73.0 in | Wt 237.4 lb

## 2019-01-07 DIAGNOSIS — I10 Essential (primary) hypertension: Secondary | ICD-10-CM | POA: Insufficient documentation

## 2019-01-07 DIAGNOSIS — T148XXA Other injury of unspecified body region, initial encounter: Secondary | ICD-10-CM | POA: Diagnosis not present

## 2019-01-07 MED ORDER — LISINOPRIL 10 MG PO TABS: 20.0000 mg | ORAL_TABLET | Freq: Every day | ORAL | 0 refills | Status: DC

## 2019-01-07 NOTE — Patient Instructions (Signed)
Increase lisinopril to 20 mg a day. (take 2 of the 10 mg tabs you have now)--> make nurse appt in 2 weeks for BP recheck.  Continue Voltaren as instructed for 2-4 weeks. We will continue to treat as muscle strain.    Please help [place] help you:  We are honored you have chosen [facility] for your Primary Care home. Below you will find basic instructions that you may need to access in the future. Please help [place] help you by reading the instructions, which cover many of the frequent questions we experience.   Prescription refills and request:  -In order to allow more efficient response time, please call your pharmacy for all refills. They will forward the request electronically to [place]. This allows for the quickest possible response. Request left on a nurse line can take longer to refill, since these are checked as time allows between office patients and other phone calls.  - refill request can take up to 3-5 working days to complete.  - If request is sent electronically and request is appropiate, it is usually completed in 1-2 business days.  - all patients will need to be seen routinely for all chronic medical conditions requiring prescription medications (see follow-up below). If you are overdue for follow up on your condition, you will be asked to make an appointment and we will call in enough medication to cover you until your appointment (up to 30 days).  - all controlled substances will require a face to face visit to request/refill.  - if you desire your prescriptions to go through a new pharmacy, and have an active script at original pharmacy, you will need to call your pharmacy and have scripts transferred to new pharmacy. This is completed between the pharmacy locations and not by your provider.    Results: If any images or labs were ordered, it can take up to 1 week to get results depending on the test ordered and the lab/facility running and resulting the test. - Normal or stable results,  which do not need further discussion, may be released to your mychart immediately with attached note to you. A call may not be generated for normal results. Please make certain to sign up for mychart. If you have questions on how to activate your mychart you can call the front office.  - If your results need further discussion, our office will attempt to contact you via phone, and if unable to reach you after 2 attempts, we will release your abnormal result to your mychart with instructions.  - All results will be automatically released in mychart after 1 week.  - Your provider will provide you with explanation and instruction on all relevant material in your results. Please keep in mind, results and labs may appear confusing or abnormal to the untrained eye, but it does not mean they are actually abnormal for you personally. If you have any questions about your results that are not covered, or you desire more detailed explanation than what was provided, you should make an appointment with your provider to do so.   Our office handles many outgoing and incoming calls daily. If we have not contacted you within 1 week about your results, please check your mychart to see if there is a message first and if not, then contact our office.  In helping with this matter, you help decrease call volume, and therefore allow [place] to be able to respond to patients needs more efficiently.   Acute office  visits (sick visit):  An acute visit is intended for a new problem and are scheduled in shorter time slots to allow schedule openings for patients with new problems. This is the appropriate visit to discuss a new problem. Problems will not be addressed by phone call or Echart message. Appointment is needed if requesting treatment. In order to provide you with excellent quality medical care with proper time for you to explain your problem, have an exam and receive treatment with instructions, these appointments should be limited  to one new problem per visit. If you experience a new problem, in which you desire to be addressed, please make an acute office visit, we save openings on the schedule to accommodate you. Please do not save your new problem for any other type of visit, let us take care of it properly and quickly for you.   Follow up visits:  Depending on your condition(s) your provider will need to see you routinely in order to provide you with quality care and prescribe medication(s). Most chronic conditions (Example: hypertension, Diabetes, depression/anxiety... etc), require visits a couple times a year. Your provider will instruct you on proper follow up for your personal medical conditions and history. Please make certain to make follow up appointments for your condition as instructed. Failing to do so could result in lapse in your medication treatment/refills. If you request a refill, and are overdue to be seen on a condition, we will always provide you with a 30 day script (once) to allow you time to schedule.    Medicare wellness (well visit): - we have a wonderful Nurse Maudie Mercury), that will meet with you and provide you will yearly medicare wellness visits. These visits should occur yearly (can not be scheduled less than 1 calendar year apart) and cover preventive health, immunizations, advance directives and screenings you are entitled to yearly through your medicare benefits. Do not miss out on your entitled benefits, this is when medicare will pay for these benefits to be ordered for you.  These are strongly encouraged by your provider and is the appropriate type of visit to make certain you are up to date with all preventive health benefits. If you have not had your medicare wellness exam in the last 12 months, please make certain to schedule one by calling the office and schedule your medicare wellness with Maudie Mercury as soon as possible.   Yearly physical (well visit):  - Adults are recommended to be seen yearly for  physicals. Check with your insurance and date of your last physical, most insurances require one calendar year between physicals. Physicals include all preventive health topics, screenings, medical exam and labs that are appropriate for gender/age and history. You may have fasting labs needed at this visit. This is a well visit (not a sick visit), new problems should not be covered during this visit (see acute visit).  - Pediatric patients are seen more frequently when they are younger. Your provider will advise you on well child visit timing that is appropriate for your their age. - This is not a medicare wellness visit. Medicare wellness exams do not have an exam portion to the visit. Some medicare companies allow for a physical, some do not allow a yearly physical. If your medicare allows a yearly physical you can schedule the medicare wellness with our nurse Maudie Mercury and have your physical with your provider after, on the same day. Please check with insurance for your full benefits.   Late Policy/No Shows:  - all  new patients should arrive 15-30 minutes earlier than appointment to allow [place] time  to  obtain all personal demographics,  insurance information and for you to complete office paperwork. - All established patients should arrive 10-15 minutes earlier than appointment time to update all information and be checked in .  - In our best efforts to run on time, if you are late for your appointment you will be asked to either reschedule or if able, we will work you back into the schedule. There will be a wait time to work you back in the schedule,  depending on availability.  - If you are unable to make it to your appointment as scheduled, please call 24 hours ahead of time to allow [place] to fill the time slot with someone else who needs to be seen. If you do not cancel your appointment ahead of time, you may be charged a no show fee.

## 2019-01-07 NOTE — Progress Notes (Signed)
Austin Trevino , May 16, 1966, 53 y.o., male MRN: [id number] Patient Care Team    Relationship Specialty Notifications Start End  Ma Hillock, DO PCP - General Family Medicine  06/29/15   Kathee Delton, MD Consulting Physician Pulmonary Disease  03/25/15     Chief Complaint  Patient presents with  . Follow-up    Ultra sound to check for hernia      Subjective:  Austin Trevino is a 53 y.o. present for abd discomfort follow up and hypertension follow up.   Hypertension:  Pt reports compliance with lisinopril 10 mg started last week. Tolerating medication . Blood pressures ranges at home are still elevated, but improved. Patient denies chest pain, shortness of breath or lower extremity edema.  BMP: 01/01/2019 WNL CBC: 01/01/2019 WNL TSH: 01/01/2019 Diet: low sodium Exercise: sedentary lifestyle.   Abd pain/stran:  Labs are all normal, [place] without hernia. He reports improvement the day after [place] and start Voltaren. Occasionally discomfort remains- but much improved.  Prior note:  Pt presents for an OV with complaints of left groin pain of 1 month duration.  Associated symptoms include pain is a deep ache. Can radiate to his back. Points to Left lower quadrant/pelvis. He denies fever, chills, nausea, vomit, pain with ejaculation, swelling or pain of scrotum.  No prior injury or surgery. No increase in activity prior to onset. Pain is worse with bowel movement, bearing down, eating, straining, bending over and turning over in bed can wake him with pain.  Pt has tried NSAIDS to ease their symptoms. He denies BPR. Last BM today.   Depression screen PHQ 2/9 03/28/2018  Decreased Interest 0  Down, Depressed, Hopeless 0  PHQ - 2 Score 0    No Known Allergies Social History   Social History Narrative   Divorced, 3 sons in middle school/HS [employer]).   Educ: BS management from [employer].  Orig from [place].   Occupation: Nurse, learning disability for [employer].   No tob, alcohol intake  occasional/social.   Past Medical History:  Diagnosis Date  . GERD (gastroesophageal reflux disease)   . Hx of colonic polyps - adenomas and ssp 02/18/2015  . Hypertension    treated "years ago" but he did some TLC/lost wt and was able to get off meds.  . Obesity, Class I, BMI 30-34.9   . OSA (obstructive sleep apnea) 01/2015   Suspected: eval by Dr. Gwenette Greet 02/2015, sleep study recommended.   Past Surgical History:  Procedure Laterality Date  . COLONOSCOPY W/ POLYPECTOMY     Family History  Problem Relation Age of Onset  . Arthritis Mother   . Hypertension Mother   . Colon polyps Mother   . Hypertension Father   . Myelodysplastic syndrome Father   . Cancer Father   . Neuropathy Sister        amyloidosis  . Prostate cancer Paternal Grandfather   . Asthma Paternal Grandmother   . Colon cancer Neg Hx    Allergies as of 01/07/2019   No Known Allergies     Medication List       Accurate as of January 07, 2019  9:39 AM. Always use your most recent med list.        diclofenac 75 MG EC tablet Commonly known as:  VOLTAREN Take 1 tablet (75 mg total) by mouth 2 (two) times daily.   lisinopril 10 MG tablet Commonly known as:  PRINIVIL,ZESTRIL Take 1 tablet (10 mg total) by mouth daily.  multivitamin tablet Take 1 tablet by mouth daily.   pantoprazole 40 MG tablet Commonly known as:  PROTONIX TAKE 1 TABLET (40 MG TOTAL) BY MOUTH DAILY. WILL NEED FOLLOW UP VISIT FOR FURTHER REFILLS.       All past medical history, surgical history, allergies, family history, immunizations andmedications were updated in the EMR today and reviewed under the history and medication portions of their EMR.     ROS: Negative, with the exception of above mentioned in HPI   Objective:  BP (!) 150/79 (BP Location: Left Arm, Patient Position: Sitting, Cuff Size: Normal)   Pulse (!) 53   Temp 98.5 F (36.9 C) (Oral)   Resp 16   Ht 6\' 1"  (1.854 m)   Wt 237 lb 6 oz (107.7 kg)   SpO2 100%    BMI 31.32 kg/m  Body mass index is 31.32 kg/m. No results found. No results found for this or any previous visit (from the past 24 hour(s)). Gen: Afebrile. No acute distress. Nontoxic in appearance. Pleasant caucasian male, obese.  HENT: AT. [place].  MMM.  Eyes:Pupils Equal Round Reactive to light, Extraocular movements intact,  Conjunctiva without redness, discharge or icterus. Neck/lymp/endocrine: Supple,no lymphadenopathy, no thyromegaly CV: RRR no murmur, no edema, +2/4 P posterior tibialis pulses Chest: CTAB, no wheeze or crackles Abd: Soft. NTND. BS present. no Masses palpated.  Neuro: Normal gait. PERLA. EOMi. Alert. Oriented.  Psych: Normal affect, dress and demeanor. Normal speech. Normal thought content and judgment.  Assessment/Plan: [name] is a 53 y.o. male present for OV for  Essential hypertension - started lisinopril 10 mg last week, improved, room for improvement--> increase to lisinopril 20 mg - nurse visit 2 weeks. Then 6 months with provider.    Abdominal tenderness of left lower quadrant, rebound tenderness presence not specified - [place] negative for hernia last week. Improved w Voltaren.  - Voltaren BID another 2-4 weeks.   - f/u PRN   Reviewed expectations re: course of current medical issues.  Discussed self-management of symptoms.  Outlined signs and symptoms indicating need for more acute intervention.  Patient verbalized understanding and all questions were answered.  Patient received an After-Visit Summary.    No orders of the defined types were placed in this encounter.    Note is dictated utilizing voice recognition software. Although note has been proof read prior to signing, occasional typographical errors still can be missed. If any questions arise, please do not hesitate to call for verification.   electronically signed by:  Howard Pouch, DO  [place]

## 2019-01-17 ENCOUNTER — Telehealth: Payer: Self-pay | Admitting: Family Medicine

## 2019-01-17 NOTE — Telephone Encounter (Signed)
Patient came in with Lisinopril 10 MG. He takes 2 a day. Can he get Rx for 20MG  or for 2 of 10MG ? Dr. Raoul Pitch increased the dosage so his current Rx runs out tomorrow. He has an appointment 01/21/19 however he does not have any to take this weekend. He uses [facility]. Please call patient at [phone].

## 2019-01-17 NOTE — Telephone Encounter (Signed)
Called [facility] and they do not have medication that was sent for patient on 01/07/2019. New Rx verbally given to Pharmacy for 20mg  #90 w/ 0 refills. Pt was called and given information and verbalized understanding.

## 2019-01-21 ENCOUNTER — Ambulatory Visit: Payer: BLUE CROSS/BLUE SHIELD | Admitting: Family Medicine

## 2019-01-21 VITALS — BP 148/94 | HR 56

## 2019-01-21 DIAGNOSIS — I10 Essential (primary) hypertension: Secondary | ICD-10-CM

## 2019-01-21 NOTE — Progress Notes (Addendum)
Austin Trevino is a 53 y.o. male presents to the office today for Blood pressure recheck secondary to elevated BP in office and increase lisinopril from 10mg  to 20 mg  Blood pressure medication: lisinopril 20mg  If on medication, Last dose was at least 1-2 hours prior to recheck: Yes. Patient states he took his medication around 8am.   Blood pressure was taken in both arms after patient rested for 10-20 minutes.   BP (!) 152/102 (BP Location: Left Arm, Patient Position: Sitting, Cuff Size: Large)   Pulse 61    Second BP check was148/94 ( BP location: right arm, patient sitting, with large curse.  Pulse was [name], CMA    ___________________________________ [name]-  Please call pt and have him increase his lisinopril from 20 mg to 40 mg, by taking 2 of the 20 mg tabs he currently has.  F/U in 1 month with provider appt for recheck to make sure he is in normal parameters. If he still has not made a physical appt- this can be his physical if scheduled appropriately.

## 2019-01-23 NOTE — Progress Notes (Signed)
Dr. Raoul Pitch,  I felt like this was a result that could wait for you to view.-thx  Signed:  Crissie Sickles, MD           01/23/2019

## 2019-01-24 ENCOUNTER — Encounter: Payer: Self-pay | Admitting: Family Medicine

## 2019-01-24 NOTE — Addendum Note (Signed)
Addended by: Caroll Rancher L on: 01/24/2019 04:02 PM   Modules accepted: Orders

## 2019-01-24 NOTE — Progress Notes (Signed)
Called pt and he was scheduled for CPE per pts request. Pharmacy was called and pt just picked up RX at the pharmacy the 21st of feb and has a refill left on file.

## 2019-01-28 ENCOUNTER — Telehealth: Payer: Self-pay | Admitting: Family Medicine

## 2019-01-28 MED ORDER — LISINOPRIL 40 MG PO TABS: 40.0000 mg | ORAL_TABLET | Freq: Every day | ORAL | 0 refills | Status: DC

## 2019-01-28 NOTE — Telephone Encounter (Signed)
Two weeks of Lisinopril sent to [facility] for patient until appt on 03/23.

## 2019-01-28 NOTE — Telephone Encounter (Signed)
[facility] for increase in Lisnopril. Patient would like all future Rx's to go to [facility] in [place]. He has a week's worth of Lisnopril.

## 2019-02-18 ENCOUNTER — Encounter: Payer: BLUE CROSS/BLUE SHIELD | Admitting: Family Medicine

## 2019-03-22 ENCOUNTER — Other Ambulatory Visit: Payer: Self-pay | Admitting: Family Medicine

## 2019-03-22 MED ORDER — LISINOPRIL 40 MG PO TABS: 40.0000 mg | ORAL_TABLET | Freq: Every day | ORAL | 0 refills | Status: DC

## 2019-03-22 NOTE — Telephone Encounter (Signed)
Tried to contact patient to let him know about refill was sent and he must keep his appt on 04/01/2019 but kept getting disconnected. Will try again.

## 2019-03-22 NOTE — Telephone Encounter (Signed)
Refilled it for him, keep his 5/4 appt. [facility].

## 2019-03-22 NOTE — Telephone Encounter (Signed)
Pt has upcoming appt but was due to come in fro recheck on BP but appt that was scheduled on 02/18/19 was canceled by provider. Please review.

## 2019-03-22 NOTE — Telephone Encounter (Signed)
Pt had appt 01/21/2019 and was to come and have BP rechecked after increase in Lisinopril. The 03/23 appt was cancelled by provider. Appt scheduled 05/04 for MD visit.   RF request for Lisinopril 40 mg daily  LOV: 02/24 Next ov: 04/01/2019 Last written: 01/28/2019  Please advise if you want me to call and see if maybe he would be willing to move appt up

## 2019-03-25 ENCOUNTER — Telehealth: Payer: Self-pay | Admitting: Family Medicine

## 2019-03-25 NOTE — Telephone Encounter (Signed)
Patient advised that he can go to pharmacy to get his BP checked if he felt comfortable doing so, he agreed.

## 2019-03-25 NOTE — Telephone Encounter (Signed)
Called patient to change his 04/01/19 CPE, appt to virtual appt.  He declined doing virtual appt for physical. I stated to him that [employer] was paying for virtual physicals. He did not understand "how" that a cpe could be done "over the phone / visually"... height,weight, blood pressure, listen to heart, etc....   Seen previous notes in chart referencing this visit and his blood pressure meds. I walked him thru procedures in connecting to virtual appt with [email or web address]. He questioned how could his BP be checked / related to this visit, if he could not physically come in the office.  I asked him if had a BP monitor at home. He denied. Please advise. Does he need to come in and do a BP check prior or after appt on 05/04?   Thank you Hinton Dyer

## 2019-04-01 ENCOUNTER — Encounter: Payer: BLUE CROSS/BLUE SHIELD | Admitting: Family Medicine

## 2019-04-01 ENCOUNTER — Encounter: Payer: Self-pay | Admitting: Family Medicine

## 2019-04-01 ENCOUNTER — Ambulatory Visit (INDEPENDENT_AMBULATORY_CARE_PROVIDER_SITE_OTHER): Payer: BLUE CROSS/BLUE SHIELD | Admitting: Family Medicine

## 2019-04-01 VITALS — BP 110/77 | HR 64 | Ht 73.0 in | Wt 222.0 lb

## 2019-04-01 DIAGNOSIS — I10 Essential (primary) hypertension: Secondary | ICD-10-CM

## 2019-04-01 DIAGNOSIS — E669 Obesity, unspecified: Secondary | ICD-10-CM | POA: Insufficient documentation

## 2019-04-01 DIAGNOSIS — E663 Overweight: Secondary | ICD-10-CM

## 2019-04-01 MED ORDER — LISINOPRIL 40 MG PO TABS: 40.0000 mg | ORAL_TABLET | Freq: Every day | ORAL | 1 refills | Status: DC

## 2019-04-01 NOTE — Progress Notes (Signed)
   VIRTUAL VISIT VIA VIDEO  I connected with Austin Trevino on 04/01/19 at  9:00 AM EDT by a video enabled telemedicine application and verified that I am speaking with the correct person using two identifiers. Location patient: Home Location provider: [facility], Office Persons participating in the virtual visit: Patient, Dr. Raoul Pitch and R.Baker, LPN  I discussed the limitations of evaluation and management by telemedicine and the availability of in person appointments. The patient expressed understanding and agreed to proceed.   SUBJECTIVE Chief Complaint  Patient presents with  . Hypertension    Pt states BP is running around 12'0's and lower 80's. Pt has been trying to lose weight.     HPI:  Hypertension:  Pt reports compliance with lisinopril 40 mg. Tolerating increase in medication. Patient denies chest pain, shortness of breath, dizziness or lower extremity edema. He has now lost 20 lbs since staying home- off the road- eating healthier.  BMP: 01/01/2019 WNL CBC: 01/01/2019 WNL TSH: 01/01/2019 Lipids; Next visit cpe->> will collect at that time.  Diet: low sodium Exercise: sedentary lifestyle.   ROS: See pertinent positives and negatives per HPI.  Patient Active Problem List   Diagnosis Date Noted  . Essential hypertension 01/07/2019  . Muscle strain 01/07/2019  . OSA (obstructive sleep apnea) 03/16/2015  . Hx of colonic polyps - adenomas and ssp 02/18/2015    Social History   Tobacco Use  . Smoking status: Never Smoker  . Smokeless tobacco: Never Used  Substance Use Topics  . Alcohol use: No    Alcohol/week: 0.0 standard drinks    Comment: socially but rare    Current Outpatient Medications:  .  lisinopril (ZESTRIL) 40 MG tablet, Take 1 tablet (40 mg total) by mouth daily. Daily, Disp: 90 tablet, Rfl: 0 .  Multiple Vitamin (MULTIVITAMIN) tablet, Take 1 tablet by mouth daily., Disp: , Rfl:  .  pantoprazole (PROTONIX) 40 MG tablet, TAKE 1 TABLET (40 MG  TOTAL) BY MOUTH DAILY. WILL NEED FOLLOW UP VISIT FOR FURTHER REFILLS., Disp: 90 tablet, Rfl: 3  No Known Allergies  OBJECTIVE: BP 110/77   Pulse 64   Ht 6\' 1"  (1.854 m)   Wt 222 lb (100.7 kg)   BMI 29.29 kg/m  Gen: No acute distress. Nontoxic in appearance.  HENT: AT. [place].  MMM.  Eyes:Pupils Equal Round Reactive to light, Extraocular movements intact,  Conjunctiva without redness, discharge or icterus. CV: no edema Chest: Cough or shortness of breath not present  Psych: Normal affect, dress and demeanor. Normal speech. Normal thought content and judgment.  ASSESSMENT AND PLAN: Austin Trevino is a 53 y.o. male present for  Essential hypertension/overeight - Stable. Doing great on lisinopril 40 mg qd.  - diet and exercise discussed. He has lost 20 lbs! - labs UTD, except lipids--> collect on CPE in 6 mos.  - F/U 6 mos CPE  > 15 minutes spent with patient, >50% of time spent face to face   Howard Pouch, DO 04/01/2019

## 2019-04-03 ENCOUNTER — Other Ambulatory Visit: Payer: Self-pay | Admitting: Family Medicine

## 2019-04-03 DIAGNOSIS — I10 Essential (primary) hypertension: Secondary | ICD-10-CM

## 2019-04-17 ENCOUNTER — Other Ambulatory Visit: Payer: Self-pay | Admitting: Family Medicine

## 2019-06-20 ENCOUNTER — Other Ambulatory Visit: Payer: Self-pay

## 2019-06-20 ENCOUNTER — Ambulatory Visit (INDEPENDENT_AMBULATORY_CARE_PROVIDER_SITE_OTHER): Payer: BC Managed Care – PPO | Admitting: Family Medicine

## 2019-06-20 ENCOUNTER — Encounter: Payer: Self-pay | Admitting: Family Medicine

## 2019-06-20 VITALS — BP 148/84 | HR 58 | Temp 98.1°F | Resp 17 | Ht 73.0 in | Wt 224.4 lb

## 2019-06-20 DIAGNOSIS — L918 Other hypertrophic disorders of the skin: Secondary | ICD-10-CM | POA: Diagnosis not present

## 2019-06-20 NOTE — Progress Notes (Signed)
OFFICE VISIT  06/20/2019   CC:  Chief Complaint  Patient presents with  . Skin Problem    x1 week. raised. tender. patient said it appear bloody, dark red in color. red rash around the area   HPI:    Patient is a 53 y.o. Caucasian male who presents for skin concern. First noted a spot on back that felt "different" a week or two ago. He can't see it but can feel it.  His son took a picture of it.  Dark purple, with small surrounding reddish area.  Past Medical History:  Diagnosis Date  . GERD (gastroesophageal reflux disease)   . Hx of colonic polyps - adenomas and ssp 02/18/2015  . Hypertension    treated "years ago" but he did some TLC/lost wt and was able to get off meds.  . Obesity, Class I, BMI 30-34.9   . OSA (obstructive sleep apnea) 01/2015   Suspected: eval by Dr. Gwenette Greet 02/2015, sleep study recommended.    Past Surgical History:  Procedure Laterality Date  . COLONOSCOPY W/ POLYPECTOMY      Outpatient Medications Prior to Visit  Medication Sig Dispense Refill  . Multiple Vitamin (MULTIVITAMIN) tablet Take 1 tablet by mouth daily.    . pantoprazole (PROTONIX) 40 MG tablet TAKE 1 TABLET (40 MG TOTAL) BY MOUTH DAILY. WILL NEED FOLLOW UP VISIT FOR FURTHER REFILLS. 90 tablet 3  . lisinopril (ZESTRIL) 40 MG tablet Take 1 tablet (40 mg total) by mouth daily. Daily (Patient not taking: Reported on 06/20/2019) 90 tablet 1   No facility-administered medications prior to visit.     No Known Allergies  ROS As per HPI  PE: Blood pressure (!) 148/84, pulse (!) 58, temperature 98.1 F (36.7 C), temperature source Temporal, resp. rate 17, height 6\' 1"  (1.854 m), weight 224 lb 6 oz (101.8 kg), SpO2 97 %. Gen: Alert, well appearing.  Patient is oriented to person, place, time, and situation. AFFECT: pleasant, lucid thought and speech. In center portion of his back in thoracic level he has a 3 mm oblong skin tag that appears irritated. The stalk was 1 mm.  LABS:  Lab Results   Component Value Date   WBC 8.2 01/01/2019   HGB 16.3 01/01/2019   HCT 47.4 01/01/2019   MCV 91.5 01/01/2019   PLT 211.0 01/01/2019     Chemistry      Component Value Date/Time   NA 139 01/01/2019 1412   K 4.3 01/01/2019 1412   CL 103 01/01/2019 1412   CO2 30 01/01/2019 1412   BUN 13 01/01/2019 1412   CREATININE 1.06 01/01/2019 1412      Component Value Date/Time   CALCIUM 9.5 01/01/2019 1412   ALKPHOS 72 01/21/2015 0836   AST 20 01/21/2015 0836   ALT 35 01/21/2015 0836   BILITOT 1.1 01/21/2015 0836     IMPRESSION AND PLAN:  Irritated skin tag. I pulled this off in exam room as I examined it.  I got the entire lesion. Band aid applied. Pt w/out questions or problems.  An After Visit Summary was printed and given to the patient.  FOLLOW UP: No follow-ups on file.  Signed:  Crissie Sickles, MD           06/20/2019

## 2019-07-28 ENCOUNTER — Encounter: Payer: Self-pay | Admitting: Internal Medicine

## 2019-08-04 IMAGING — US US PELVIS LIMITED
1 series · 14 of 18 positions shown · non-contrast
Comparison: None.

CLINICAL DATA: Left inguinal pain x4 weeks, evaluate for hernia

EXAM:
LIMITED ULTRASOUND OF PELVIS
TECHNIQUE: Limited transabdominal ultrasound examination of the pelvis was
performed.

[Series 1: us pelvis limited · 0.07mm/px · 18 acquisitions, 14 frames shown]
[im 1/18]
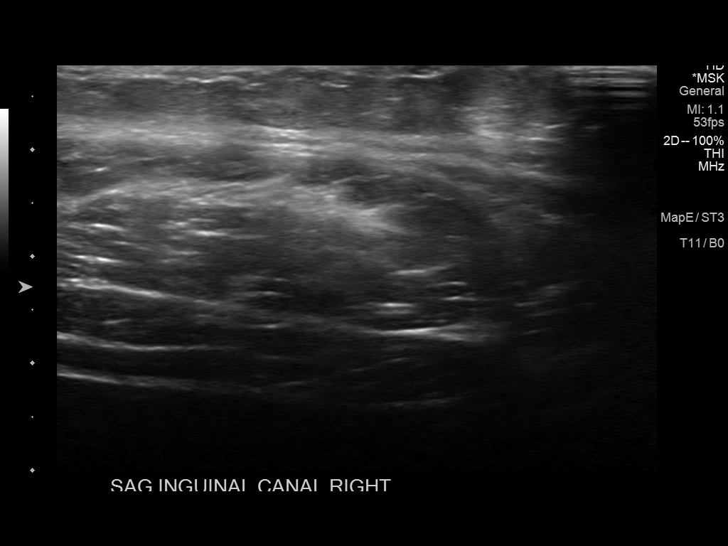
[im 2/18]
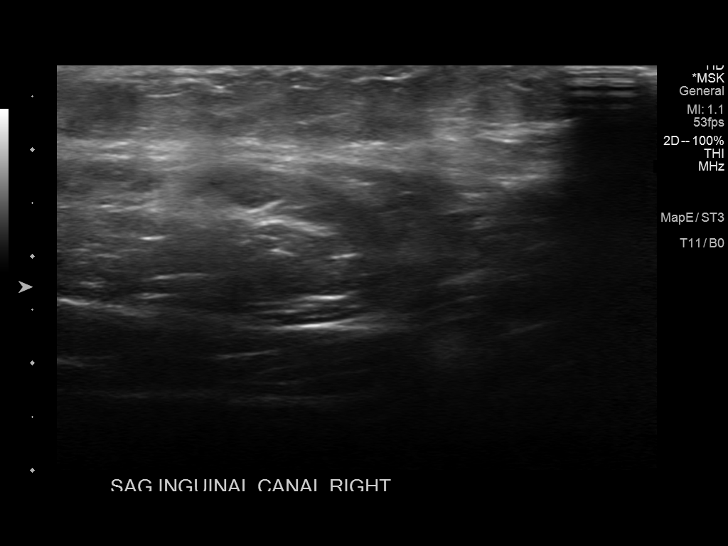
[im 4/18]
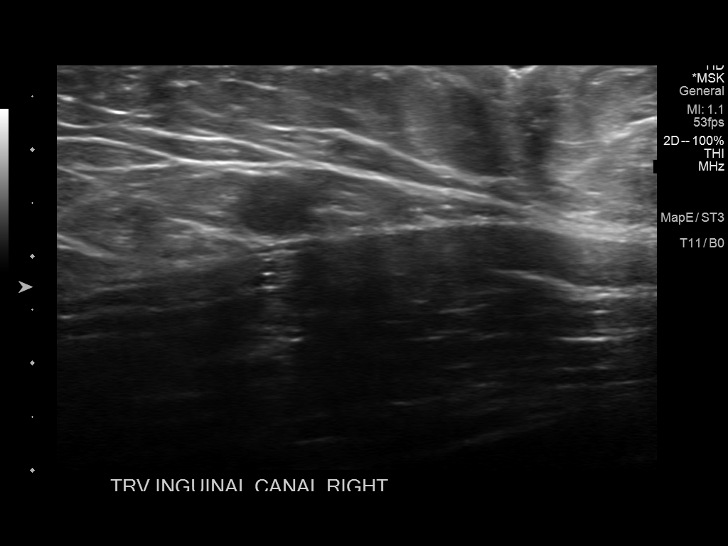
[im 5/18]
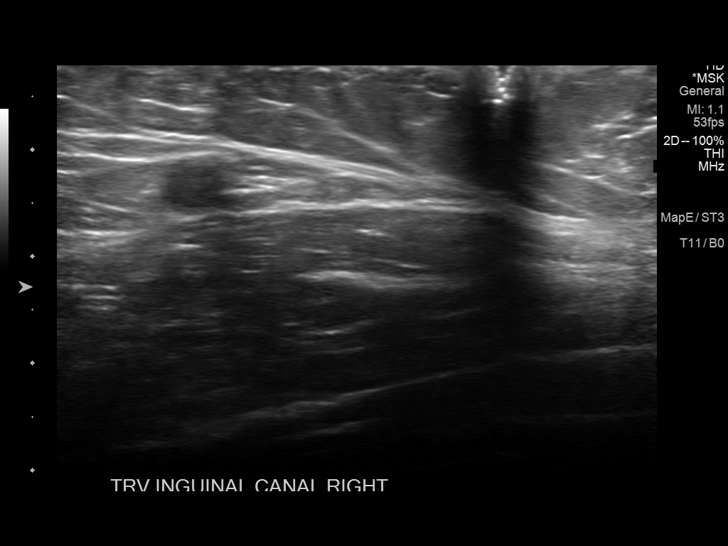
[im 6/18]
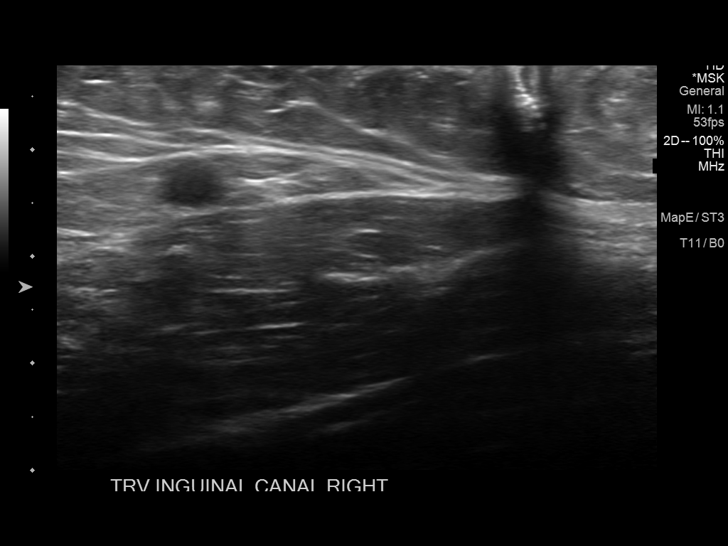
[im 8/18]
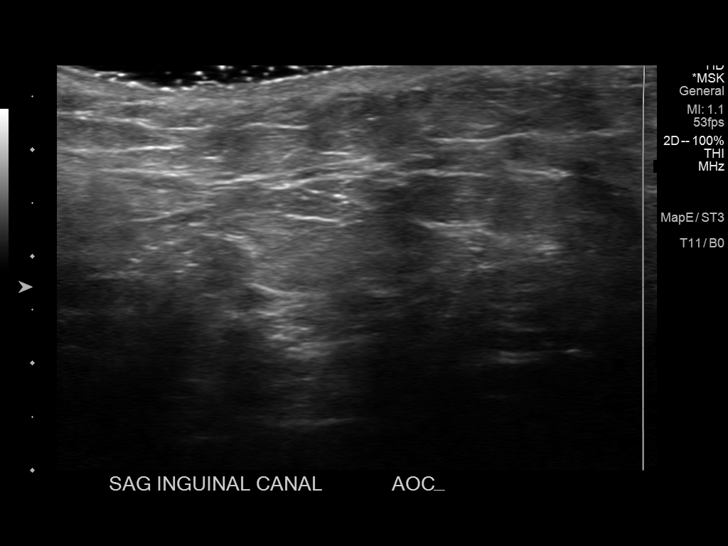
[im 9/18]
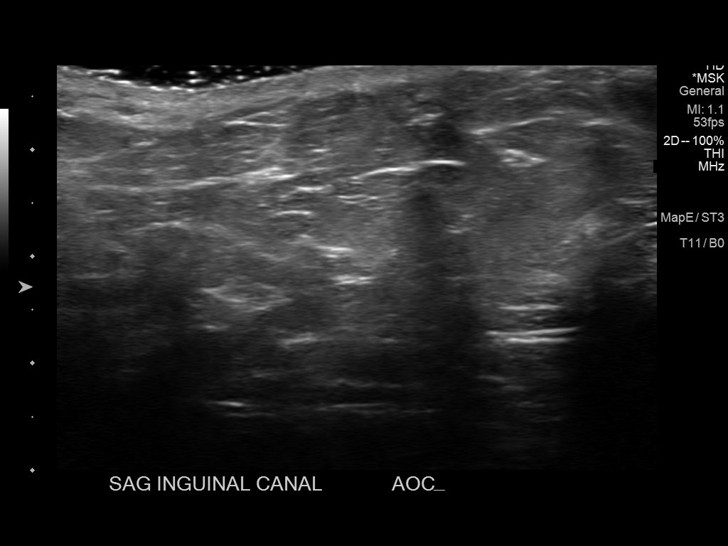
[im 10/18]
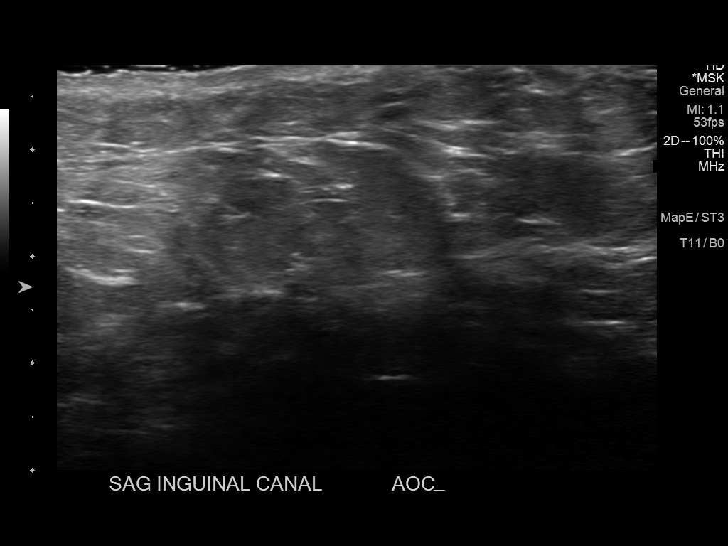
[im 11/18]
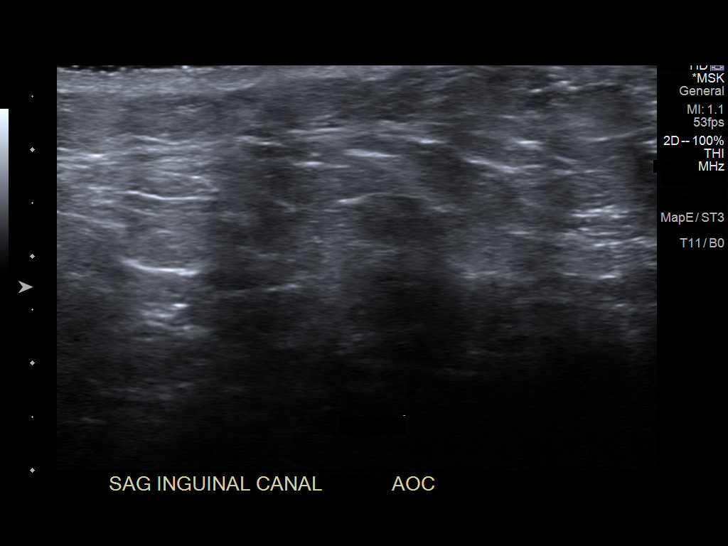
[im 13/18]
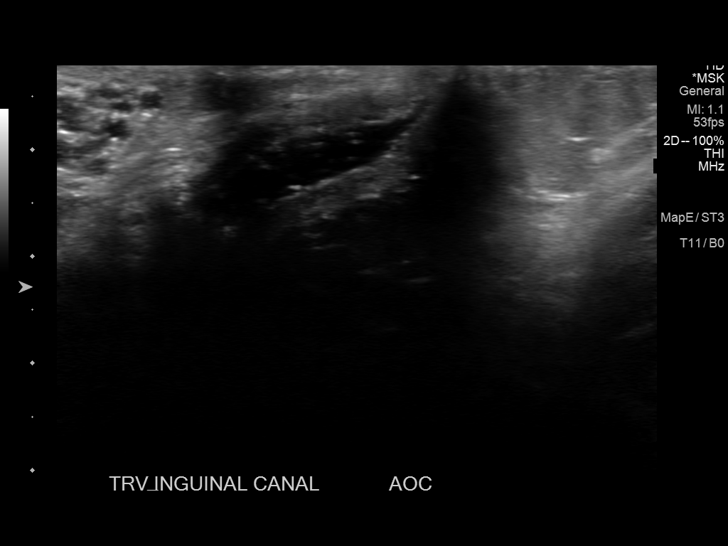
[im 14/18]
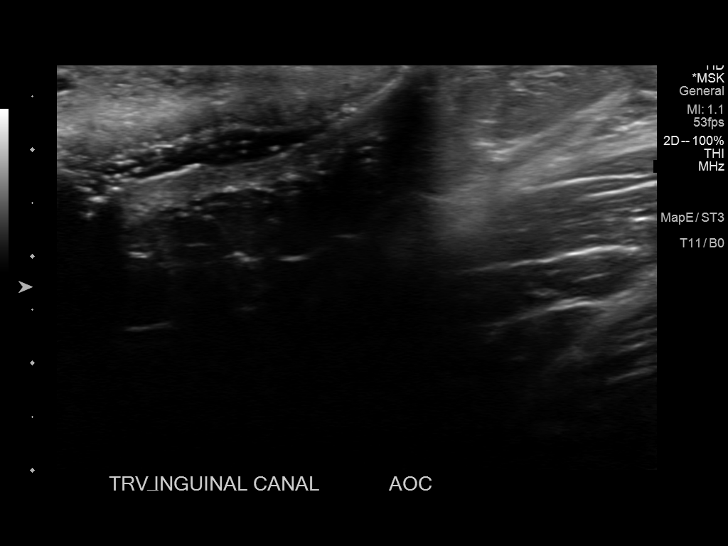
[im 15/18]
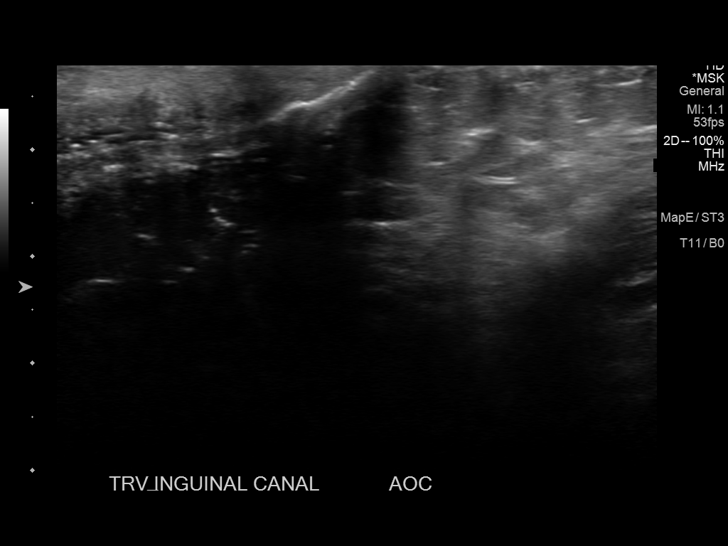
[im 17/18]
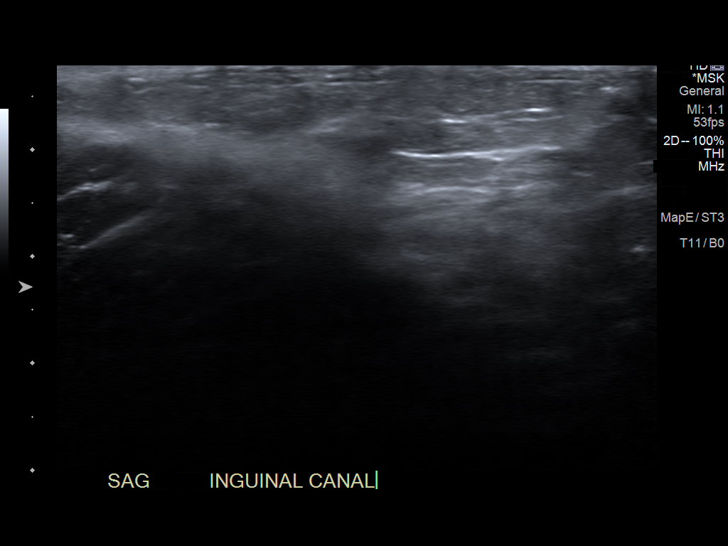
[im 18/18]
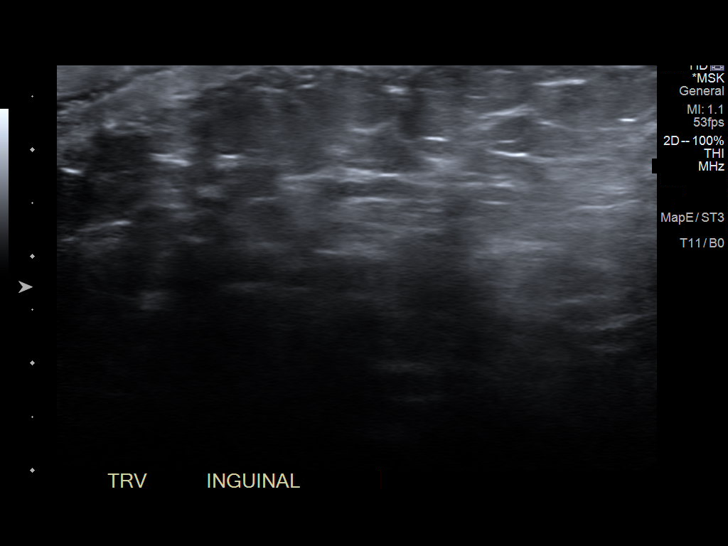

[14 of 18 positions shown; findings below may reference images not displayed]

FINDINGS: Targeted ultrasound was performed in the bilateral inguinal regions,
with and without Valsalva. No evidence of inguinal hernia.

No mass, fluid collection, or abscess is visualized.
IMPRESSION: No evidence of inguinal hernia.

## 2019-10-04 ENCOUNTER — Other Ambulatory Visit: Payer: Self-pay

## 2019-10-04 ENCOUNTER — Ambulatory Visit (INDEPENDENT_AMBULATORY_CARE_PROVIDER_SITE_OTHER): Payer: BC Managed Care – PPO | Admitting: Family Medicine

## 2019-10-04 ENCOUNTER — Encounter: Payer: Self-pay | Admitting: Family Medicine

## 2019-10-04 VITALS — BP 140/98 | HR 67 | Temp 97.2°F | Resp 16 | Ht 72.75 in | Wt 230.1 lb

## 2019-10-04 DIAGNOSIS — I10 Essential (primary) hypertension: Secondary | ICD-10-CM | POA: Diagnosis not present

## 2019-10-04 DIAGNOSIS — Z131 Encounter for screening for diabetes mellitus: Secondary | ICD-10-CM

## 2019-10-04 DIAGNOSIS — Z1159 Encounter for screening for other viral diseases: Secondary | ICD-10-CM

## 2019-10-04 DIAGNOSIS — Z125 Encounter for screening for malignant neoplasm of prostate: Secondary | ICD-10-CM

## 2019-10-04 DIAGNOSIS — Z23 Encounter for immunization: Secondary | ICD-10-CM | POA: Diagnosis not present

## 2019-10-04 DIAGNOSIS — Z0001 Encounter for general adult medical examination with abnormal findings: Secondary | ICD-10-CM

## 2019-10-04 DIAGNOSIS — Z860100 Personal history of colon polyps, unspecified: Secondary | ICD-10-CM

## 2019-10-04 DIAGNOSIS — K219 Gastro-esophageal reflux disease without esophagitis: Secondary | ICD-10-CM | POA: Diagnosis not present

## 2019-10-04 LAB — PSA: PSA: 2.38 ng/mL (ref 0.10–4.00)

## 2019-10-04 LAB — HEMOGLOBIN A1C: Hgb A1c MFr Bld: 5.2 % (ref 4.6–6.5)

## 2019-10-04 LAB — LIPID PANEL
Cholesterol: 186 mg/dL (ref 0–200)
HDL: 44 mg/dL (ref >39.00)
LDL Cholesterol: 123 mg/dL — ABNORMAL HIGH (ref 0–99)
NonHDL: 141.66
Total CHOL/HDL Ratio: 4
Triglycerides: 93 mg/dL (ref 0.0–149.0)
VLDL: 18.6 mg/dL (ref 0.0–40.0)

## 2019-10-04 LAB — COMPREHENSIVE METABOLIC PANEL
ALT: 30 U/L (ref 0–53)
AST: 18 U/L (ref 0–37)
Albumin: 4.2 g/dL (ref 3.5–5.2)
Alkaline Phosphatase: 68 U/L (ref 39–117)
BUN: 14 mg/dL (ref 6–23)
CO2: 26 mEq/L (ref 19–32)
Calcium: 9.5 mg/dL (ref 8.4–10.5)
Chloride: 104 mEq/L (ref 96–112)
Creatinine, Ser: 1.01 mg/dL (ref 0.40–1.50)
GFR: 76.62 mL/min (ref >60.00)
Glucose, Bld: 91 mg/dL (ref 70–99)
Potassium: 4.2 mEq/L (ref 3.5–5.1)
Sodium: 141 mEq/L (ref 135–145)
Total Bilirubin: 0.8 mg/dL (ref 0.2–1.2)
Total Protein: 6.9 g/dL (ref 6.0–8.3)

## 2019-10-04 MED ORDER — PANTOPRAZOLE SODIUM 40 MG PO TBEC: 40.0000 mg | DELAYED_RELEASE_TABLET | Freq: Every day | ORAL | 3 refills | Status: DC

## 2019-10-04 MED ORDER — LISINOPRIL 40 MG PO TABS: 40.0000 mg | ORAL_TABLET | Freq: Every day | ORAL | 1 refills | Status: DC

## 2019-10-04 MED ORDER — AMLODIPINE BESYLATE 2.5 MG PO TABS: 2.5000 mg | ORAL_TABLET | Freq: Every day | ORAL | 3 refills | Status: DC

## 2019-10-04 NOTE — Patient Instructions (Signed)
Health Maintenance, Male Adopting a healthy lifestyle and getting preventive care are important in promoting health and wellness. Ask your health care provider about:  The right schedule for you to have regular tests and exams.  Things you can do on your own to prevent diseases and keep yourself healthy. What should I know about diet, weight, and exercise? Eat a healthy diet   Eat a diet that includes plenty of vegetables, fruits, low-fat dairy products, and lean protein.  Do not eat a lot of foods that are high in solid fats, added sugars, or sodium. Maintain a healthy weight Body mass index (BMI) is a measurement that can be used to identify possible weight problems. It estimates body fat based on height and weight. Your health care provider can help determine your BMI and help you achieve or maintain a healthy weight. Get regular exercise Get regular exercise. This is one of the most important things you can do for your health. Most adults should:  Exercise for at least 150 minutes each week. The exercise should increase your heart rate and make you sweat (moderate-intensity exercise).  Do strengthening exercises at least twice a week. This is in addition to the moderate-intensity exercise.  Spend less time sitting. Even light physical activity can be beneficial. Watch cholesterol and blood lipids Have your blood tested for lipids and cholesterol at 55 years of age, then have this test every 5 years. You may need to have your cholesterol levels checked more often if:  Your lipid or cholesterol levels are high.  You are older than 55 years of age.  You are at high risk for heart disease. What should I know about cancer screening? Many types of cancers can be detected early and may often be prevented. Depending on your health history and family history, you may need to have cancer screening at various ages. This may include screening for:  Colorectal cancer.  Prostate  cancer.  Skin cancer.  Lung cancer. What should I know about heart disease, diabetes, and high blood pressure? Blood pressure and heart disease  High blood pressure causes heart disease and increases the risk of stroke. This is more likely to develop in people who have high blood pressure readings, are of African descent, or are overweight.  Talk with your health care provider about your target blood pressure readings.  Have your blood pressure checked: ? Every 3-5 years if you are 30-46 years of age. ? Every year if you are 78 years old or older.  If you are between the ages of 69 and 47 and are a current or former smoker, ask your health care provider if you should have a one-time screening for abdominal aortic aneurysm (AAA). Diabetes Have regular diabetes screenings. This checks your fasting blood sugar level. Have the screening done:  Once every three years after age 60 if you are at a normal weight and have a low risk for diabetes.  More often and at a younger age if you are overweight or have a high risk for diabetes. What should I know about preventing infection? Hepatitis B If you have a higher risk for hepatitis B, you should be screened for this virus. Talk with your health care provider to find out if you are at risk for hepatitis B infection. Hepatitis C Blood testing is recommended for:  Everyone born from [birth year] through 1967.  Anyone with known risk factors for hepatitis C. Sexually transmitted infections (STIs)  You should be screened each year  for STIs, including gonorrhea and chlamydia, if: ? You are sexually active and are younger than 55 years of age. ? You are older than 55 years of age and your health care provider tells you that you are at risk for this type of infection. ? Your sexual activity has changed since you were last screened, and you are at increased risk for chlamydia or gonorrhea. Ask your health care provider if you are at risk.  Ask your  health care provider about whether you are at high risk for HIV. Your health care provider may recommend a prescription medicine to help prevent HIV infection. If you choose to take medicine to prevent HIV, you should first get tested for HIV. You should then be tested every 3 months for as long as you are taking the medicine. Follow these instructions at home: Lifestyle  Do not use any products that contain nicotine or tobacco, such as cigarettes, e-cigarettes, and chewing tobacco. If you need help quitting, ask your health care provider.  Do not use street drugs.  Do not share needles.  Ask your health care provider for help if you need support or information about quitting drugs. Alcohol use  Do not drink alcohol if your health care provider tells you not to drink.  If you drink alcohol: ? Limit how much you have to 0-2 drinks a day. ? Be aware of how much alcohol is in your drink. In the [place] one drink equals one 12 oz bottle of beer (355 mL), one 5 oz glass of wine (148 mL), or one 1 oz glass of hard liquor (44 mL). General instructions  Schedule regular health, dental, and eye exams.  Stay current with your vaccines.  Tell your health care provider if: ? You often feel depressed. ? You have ever been abused or do not feel safe at home. Summary  Adopting a healthy lifestyle and getting preventive care are important in promoting health and wellness.  Follow your health care provider's instructions about healthy diet, exercising, and getting tested or screened for diseases.  Follow your health care provider's instructions on monitoring your cholesterol and blood pressure. This information is not intended to replace advice given to you by your health care provider. Make sure you discuss any questions you have with your health care provider. Document Released: 05/12/2008 Document Revised: 11/07/2018 Document Reviewed: 11/07/2018 Elsevier Patient Education  [place].    Body mass index is 30.57 kg/m.    BMI for Adults  Body mass index (BMI) is a number that is calculated from a person's weight and height. BMI may help to estimate how much of a person's weight is composed of fat. BMI can help identify those who may be at higher risk for certain medical problems. How is BMI used with adults? BMI is used as a screening tool to identify possible weight problems. It is used to check whether a person is obese, overweight, healthy weight, or underweight. How is BMI calculated? BMI measures your weight and compares it to your height. This can be done either in [place] ([place] or metric measurements. Note that charts are available to help you find your BMI quickly and easily without having to do these calculations yourself. To calculate your BMI in English ([place] measurements, your health care provider will: 1. Measure your weight in pounds (lb). 2. Multiply the number of pounds by 703. ? For example, for a person who weighs 180 lb, multiply that number by 703, which equals  [id number]. 3. Measure your height in inches (in). Then multiply that number by itself to get a measurement called "inches squared." ? For example, for a person who is 70 in tall, the "inches squared" measurement is 70 in x 70 in, which equals 4900 inches squared. 4. Divide the total from Step 2 (number of lb x 703) by the total from Step 3 (inches squared): 126,540  4900 = 25.8. This is your BMI. To calculate your BMI in metric measurements, your health care provider will: 1. Measure your weight in kilograms (kg). 2. Measure your height in meters (m). Then multiply that number by itself to get a measurement called "meters squared." ? For example, for a person who is 1.75 m tall, the "meters squared" measurement is 1.75 m x 1.75 m, which is equal to 3.1 meters squared. 3. Divide the number of kilograms (your weight) by the meters squared number. In this example: 70  3.1 = 22.6. This is  your BMI. How is BMI interpreted? To interpret your results, your health care provider will use BMI charts to identify whether you are underweight, normal weight, overweight, or obese. The following guidelines will be used:  Underweight: BMI less than 18.5.  Normal weight: BMI between 18.5 and 24.9.  Overweight: BMI between 25 and 29.9.  Obese: BMI of 30 and above. Please note:  Weight includes both fat and muscle, so someone with a muscular build, such as an athlete, may have a BMI that is higher than 24.9. In cases like these, BMI is not an accurate measure of body fat.  To determine if excess body fat is the cause of a BMI of 25 or higher, further assessments may need to be done by a health care provider.  BMI is usually interpreted in the same way for men and women. Why is BMI a useful tool? BMI is useful in two ways:  Identifying a weight problem that may be related to a medical condition, or that may increase the risk for medical problems.  Promoting lifestyle and diet changes in order to reach a healthy weight. Summary  Body mass index (BMI) is a number that is calculated from a person's weight and height.  BMI may help to estimate how much of a person's weight is composed of fat. BMI can help identify those who may be at higher risk for certain medical problems.  BMI can be measured using English measurements or metric measurements.  To interpret your results, your health care provider will use BMI charts to identify whether you are underweight, normal weight, overweight, or obese. This information is not intended to replace advice given to you by your health care provider. Make sure you discuss any questions you have with your health care provider. Document Released: 07/26/2004 Document Revised: 10/27/2017 Document Reviewed: 09/27/2017 Elsevier Patient Education  2020 [employer].

## 2019-10-04 NOTE — Progress Notes (Signed)
Patient ID: Austin Trevino, male  DOB: 1966/06/28, 53 y.o.   MRN: [id number] Patient Care Team    Relationship Specialty Notifications Start End  Austin Hillock, DO PCP - General Family Medicine  06/29/15   Clance, Armando Reichert, MD Consulting Physician Pulmonary Disease  03/25/15   Austin Mayer, MD Consulting Physician Gastroenterology  10/04/19     Chief Complaint  Patient presents with  . Annual Exam    Fasting. Pt took BP medication 15 mins ago.     Subjective:  Austin Trevino is a 53 y.o.  Male  present for CPE. All past medical history, surgical history, allergies, family history, immunizations, medications and social history were updated in the electronic medical record today. All recent labs, ED visits and hospitalizations within the last year were reviewed.  Hypertension: Pt reports compliance  withlisinopril 40 mg.Tolerating increase in medication. Patient denies chest pain, shortness of breath, dizziness or lower extremity edema.  BMP:01/01/2019 WNL CBC:01/01/2019 WNL TSH: 01/01/2019 Lipids; collected today Diet: low sodium Exercise: sedentary lifestyle  Health maintenance:  Colonoscopy: completed 2017, by Austin Trevino, . follow up 3 yrs>>overdue as of 07/2019>> referral placed.  Immunizations: tdap UTD 2018, Influenza 10/20 (encouraged yearly), Shingrix #1 today Infectious disease screening: HIV declined and Hep C completed today PSA: NL 2016>> ordered today Assistive device: none Oxygen AJO:INOM Patient has a Dental home. Hospitalizations/ED visits: reviewed   Depression screen Austin Trevino 2/9 10/04/2019 03/28/2018  Decreased Interest 0 0  Down, Depressed, Hopeless 0 0  PHQ - 2 Score 0 0   No flowsheet data found.  Immunization History  Administered Date(s) Administered  . Influenza-Unspecified 08/28/2014  . Tdap 07/02/2017    Past Medical History:  Diagnosis Date  . GERD (gastroesophageal reflux disease)   . Hx of colonic polyps - adenomas and ssp 02/18/2015   . Hypertension    treated "years ago" but he did some TLC/lost wt and was able to get off meds.  . Obesity, Class I, BMI 30-34.9   . OSA (obstructive sleep apnea) 01/2015   Suspected: eval by Austin Trevino 02/2015, sleep study recommended.   No Known Allergies Past Surgical History:  Procedure Laterality Date  . COLONOSCOPY W/ POLYPECTOMY     Family History  Problem Relation Age of Onset  . Arthritis Mother   . Hypertension Mother   . Colon polyps Mother   . Hypertension Father   . Myelodysplastic syndrome Father   . Cancer Father   . Neuropathy Sister        amyloidosis  . Prostate cancer Paternal Grandfather   . Asthma Paternal Grandmother   . Colon cancer Neg Hx    Social History   Social History Narrative   Divorced, 3 sons in middle school/HS [employer]).   Educ: BS management from [place].  Orig from [place].   Occupation: Nurse, learning disability for [employer].   No tob, alcohol intake occasional/social.    Allergies as of 10/04/2019   No Known Allergies     Medication List       Accurate as of October 04, 2019  8:39 AM. If you have any questions, ask your nurse or doctor.        amLODipine 2.5 MG tablet Commonly known as: NORVASC Take 1 tablet (2.5 mg total) by mouth daily. Started by: Austin Pouch, DO   lisinopril 40 MG tablet Commonly known as: ZESTRIL Take 1 tablet (40 mg total) by mouth daily. Daily   multivitamin tablet Take  1 tablet by mouth daily.   pantoprazole 40 MG tablet Commonly known as: PROTONIX Take 1 tablet (40 mg total) by mouth daily. Will need follow up visit for further refills.       All past medical history, surgical history, allergies, family history, immunizations andmedications were updated in the EMR today and reviewed under the history and medication portions of their EMR.     No results found for this or any previous visit (from the past 2160 hour(s)).   ROS: 14 pt review of systems performed and negative (unless  mentioned in an HPI)  Objective: BP (!) 140/98 (BP Location: Left Arm, Patient Position: Sitting, Cuff Size: Normal)   Pulse 67   Temp (!) 97.2 F (36.2 C) (Temporal)   Resp 16   Ht 6' 0.75" (1.848 m)   Wt 230 lb 2 oz (104.4 kg)   SpO2 98%   BMI 30.57 kg/m  Gen: Afebrile. No acute distress. Nontoxic in appearance, well-developed, well-nourished,  Very pleasant caucasian male. Obese.  HENT: AT. [place]. Bilateral TM visualized and normal in appearance, normal external auditory canal. MMM, no oral lesions, adequate dentition. Bilateral nares within normal limits. Throat without erythema, ulcerations or exudates. no Cough on exam, no hoarseness on exam. Eyes:Pupils Equal Round Reactive to light, Extraocular movements intact,  Conjunctiva without redness, discharge or icterus. Neck/lymp/endocrine: Supple,no lymphadenopathy, no thyromegaly CV: RRR no murmur, no edema, +2/4 P posterior tibialis pulses. no carotid bruits. No JVD. Chest: CTAB, no wheeze, rhonchi or crackles. normal Respiratory effort. good Air movement. Abd: Soft. flat. NTND. BS present. no Masses palpated. No hepatosplenomegaly. No rebound tenderness or guarding. Skin: no rashes, purpura or petechiae. Warm and well-perfused. Skin intact. Neuro/Msk:  Normal gait. PERLA. EOMi. Alert. Oriented x3.  Cranial nerves II through XII intact. Muscle strength 5/5 upper/lower extremity. DTRs equal bilaterally. Psych: Normal affect, dress and demeanor. Normal speech. Normal thought content and judgment.  No exam data present  Assessment/plan: Austin Trevino is a 53 y.o. male present for CPE  Essential hypertension/obesity -above goal. He has gained some weight back. Discussed diet and exercise.  - goal <130/80 - continue  lisinopril 40 mg qd.  - amlodipine 2.5 mg added today.  - diet and exercise discussed.  - cmp, lipid collected today - F/U 6 mos Hx of colonic polyps - adenomas and ssp Overdue for 3 yr follow up - Ambulatory  referral to Gastroenterology Gastroesophageal reflux disease without esophagitis Stable. Refilled Protonix for him.  Prostate cancer screening - PSA Diabetes mellitus screening - HgB A1c Need for hepatitis C screening test - Hepatitis C Antibody Encounter for general adult medical examination with abnormal findings Patient was encouraged to exercise greater than 150 minutes a week. Patient was encouraged to choose a diet filled with fresh fruits and vegetables, and lean meats. AVS provided to patient today for education/recommendation on gender specific health and safety maintenance. Colonoscopy: completed 2017, by Austin Trevino, . follow up 3 yrs>>overdue as of 07/2019>> referral placed.  Immunizations: tdap UTD 2018, Influenza 10/20 (encouraged yearly), Shingrix #1 today Infectious disease screening: HIV declined and Hep C completed today PSA: NL 2016>> ordered today  Return in about 6 months (around 04/02/2020) for CMC (30 min).  3 mos for nurse visit- shingrix #2 1 yr for CPE  Orders Placed This Encounter  Procedures  . Comp Met (CMET)  . HgB A1c  . Lipid panel  . PSA  . Hepatitis C Antibody  . Ambulatory referral to Gastroenterology  Electronically signed by: Austin Pouch, DO [place]

## 2019-10-07 LAB — HEPATITIS C ANTIBODY
Hepatitis C Ab: NONREACTIVE
SIGNAL TO CUT-OFF: 0.02 (ref <1.00)

## 2019-10-18 ENCOUNTER — Emergency Department (HOSPITAL_BASED_OUTPATIENT_CLINIC_OR_DEPARTMENT_OTHER): Payer: BC Managed Care – PPO

## 2019-10-18 ENCOUNTER — Other Ambulatory Visit: Payer: Self-pay

## 2019-10-18 ENCOUNTER — Emergency Department (HOSPITAL_BASED_OUTPATIENT_CLINIC_OR_DEPARTMENT_OTHER)
Admission: EM | Admit: 2019-10-18 | Discharge: 2019-10-19 | Disposition: A | Discharge status: Home / Self Care | Payer: BC Managed Care – PPO | Attending: Emergency Medicine | Admitting: Emergency Medicine

## 2019-10-18 ENCOUNTER — Encounter (HOSPITAL_BASED_OUTPATIENT_CLINIC_OR_DEPARTMENT_OTHER): Payer: Self-pay

## 2019-10-18 DIAGNOSIS — Z79899 Other long term (current) drug therapy: Secondary | ICD-10-CM | POA: Diagnosis not present

## 2019-10-18 DIAGNOSIS — S61411A Laceration without foreign body of right hand, initial encounter: Secondary | ICD-10-CM | POA: Diagnosis not present

## 2019-10-18 DIAGNOSIS — Y999 Unspecified external cause status: Secondary | ICD-10-CM | POA: Diagnosis not present

## 2019-10-18 DIAGNOSIS — W268XXA Contact with other sharp object(s), not elsewhere classified, initial encounter: Secondary | ICD-10-CM | POA: Diagnosis not present

## 2019-10-18 DIAGNOSIS — Y92828 Other wilderness area as the place of occurrence of the external cause: Secondary | ICD-10-CM | POA: Insufficient documentation

## 2019-10-18 DIAGNOSIS — I10 Essential (primary) hypertension: Secondary | ICD-10-CM | POA: Insufficient documentation

## 2019-10-18 DIAGNOSIS — S6991XA Unspecified injury of right wrist, hand and finger(s), initial encounter: Secondary | ICD-10-CM | POA: Diagnosis not present

## 2019-10-18 DIAGNOSIS — Y9389 Activity, other specified: Secondary | ICD-10-CM | POA: Diagnosis not present

## 2019-10-18 MED ORDER — LIDOCAINE HCL 2 % IJ SOLN
INTRAMUSCULAR | Status: AC
  Filled 2019-10-18: qty 20

## 2019-10-18 MED ORDER — LIDOCAINE HCL 2 % IJ SOLN
20.0000 mL | Freq: Once | INTRAMUSCULAR | Status: AC
  Administered 2019-10-19: 400 mg via INTRADERMAL

## 2019-10-18 MED ORDER — TETANUS-DIPHTH-ACELL PERTUSSIS 5-2.5-18.5 LF-MCG/0.5 IM SUSP
0.5000 mL | Freq: Once | INTRAMUSCULAR | Status: DC
  Filled 2019-10-18: qty 0.5

## 2019-10-18 MED ORDER — CIPROFLOXACIN HCL 500 MG PO TABS
500.0000 mg | ORAL_TABLET | Freq: Once | ORAL | Status: AC
  Administered 2019-10-18: 500 mg via ORAL
  Filled 2019-10-18: qty 1

## 2019-10-18 NOTE — ED Notes (Signed)
Pt hand soaking in sterile water/ iodine mix.

## 2019-10-18 NOTE — ED Triage Notes (Addendum)
Pt states he cut right hand on a fence gait ~6pm-lac noted between 4th5th digits/across knuckle-NAD-steady gait

## 2019-10-18 NOTE — ED Provider Notes (Addendum)
[facility] EMERGENCY DEPARTMENT Provider Note   CSN: [id number] Arrival date & time: 10/18/19  2206     History   Chief Complaint Chief Complaint  Patient presents with  . Hand Injury    HPI Austin Trevino is a 53 y.o. male.     The history is provided by the patient.  Hand Injury Location:  Hand Hand location:  R hand Injury: yes (cut on gate )   Time since incident:  5 hours Mechanism of injury comment:  Cut on gate Pain details:    Quality:  Aching Dislocation: no   Foreign body present:  No foreign bodies Tetanus status:  Up to date Prior injury to area:  No Relieved by:  Nothing Worsened by:  Nothing Ineffective treatments:  None tried Associated symptoms: no fever and no swelling   Risk factors: no concern for non-accidental trauma   Patient was deer hunting and had on gloves but cut on a gate at 6 pm. Patient's tetanus was in 2018.    Past Medical History:  Diagnosis Date  . GERD (gastroesophageal reflux disease)   . Hx of colonic polyps - adenomas and ssp 02/18/2015  . Hypertension    treated "years ago" but he did some TLC/lost wt and was able to get off meds.  . Obesity, Class I, BMI 30-34.9   . OSA (obstructive sleep apnea) 01/2015   Suspected: eval by Dr. Gwenette Greet 02/2015, sleep study recommended.    Patient Active Problem List   Diagnosis Date Noted  . GERD (gastroesophageal reflux disease) 10/04/2019  . Encounter for general adult medical examination with abnormal findings 10/04/2019  . Obesity (BMI 30-39.9) 04/01/2019  . Essential hypertension 01/07/2019  . Muscle strain 01/07/2019  . OSA (obstructive sleep apnea) 03/16/2015  . Hx of colonic polyps - adenomas and ssp 02/18/2015    Past Surgical History:  Procedure Laterality Date  . COLONOSCOPY W/ POLYPECTOMY          Home Medications    Prior to Admission medications   Medication Sig Start Date End Date Taking? Authorizing Provider  amLODipine (NORVASC) 2.5 MG tablet  Take 1 tablet (2.5 mg total) by mouth daily. 10/04/19   Kuneff, Renee A, DO  lisinopril (ZESTRIL) 40 MG tablet Take 1 tablet (40 mg total) by mouth daily. Daily 10/04/19   Kuneff, Renee A, DO  Multiple Vitamin (MULTIVITAMIN) tablet Take 1 tablet by mouth daily.    [provider]  pantoprazole (PROTONIX) 40 MG tablet Take 1 tablet (40 mg total) by mouth daily. Will need follow up visit for further refills. 10/04/19   Ma Hillock, DO    Family History Family History  Problem Relation Age of Onset  . Arthritis Mother   . Hypertension Mother   . Colon polyps Mother   . Hypertension Father   . Myelodysplastic syndrome Father   . Cancer Father   . Neuropathy Sister        amyloidosis  . Prostate cancer Paternal Grandfather   . Asthma Paternal Grandmother   . Colon cancer Neg Hx     Social History Social History   Tobacco Use  . Smoking status: Never Smoker  . Smokeless tobacco: Never Used  Substance Use Topics  . Alcohol use: No    Alcohol/week: 0.0 standard drinks    Comment: socially but rare  . Drug use: No     Allergies   Patient has no known allergies.   Review of Systems Review of Systems  Constitutional: Negative for fever.  HENT: Negative for congestion.   Eyes: Negative for visual disturbance.  Respiratory: Negative for cough.   Cardiovascular: Negative for chest pain.  Gastrointestinal: Negative for abdominal pain.  Musculoskeletal: Negative for gait problem.  Skin: Positive for wound.  Neurological: Negative for weakness and numbness.  Psychiatric/Behavioral: Negative for agitation.  All other systems reviewed and are negative.    Physical Exam Updated Vital Signs BP (!) 191/112 (BP Location: Left Arm)   Pulse 66   Temp 98.8 F (37.1 C) (Oral)   Resp 16   Ht 5\' 11"  (1.803 m)   Wt 104.3 kg   SpO2 100%   BMI 32.08 kg/m   Physical Exam Vitals signs and nursing note reviewed.  Constitutional:      General: He is not in acute  distress.    Appearance: He is normal weight.  HENT:     Head: Normocephalic and atraumatic.     Nose: Nose normal.  Eyes:     Conjunctiva/sclera: Conjunctivae normal.     Pupils: Pupils are equal, round, and reactive to light.  Neck:     Musculoskeletal: Normal range of motion and neck supple.  Cardiovascular:     Rate and Rhythm: Normal rate and regular rhythm.     Pulses: Normal pulses.     Heart sounds: Normal heart sounds.  Pulmonary:     Effort: Pulmonary effort is normal.     Breath sounds: Normal breath sounds.  Abdominal:     General: Abdomen is flat. Bowel sounds are normal.     Tenderness: There is no abdominal tenderness. There is no guarding.  Musculoskeletal: Normal range of motion.     Right hand: He exhibits laceration. He exhibits normal capillary refill. Normal sensation noted.       Hands:  Skin:    General: Skin is warm and dry.     Capillary Refill: Capillary refill takes less than 2 seconds.  Neurological:     General: No focal deficit present.     Mental Status: He is alert and oriented to person, place, and time.  Psychiatric:        Mood and Affect: Mood normal.        Behavior: Behavior normal.      ED Treatments / Results  Labs (all labs ordered are listed, but only abnormal results are displayed) Labs Reviewed - No data to display  EKG None  Radiology Dg Hand Complete Right  Result Date: 10/18/2019 CLINICAL DATA:  Laceration EXAM: RIGHT HAND - COMPLETE 3+ VIEW COMPARISON:  None. FINDINGS: There is no evidence of fracture or dislocation. There is no evidence of arthropathy or other focal bone abnormality. Soft tissues are unremarkable. IMPRESSION: Negative. Electronically Signed   By: Donavan Foil M.D.   On: 10/18/2019 23:05    Procedures .Marland KitchenLaceration Repair  Date/Time: 10/19/2019 12:21 AM Performed by: Veatrice Kells, MD Authorized by: Veatrice Kells, MD   Consent:    Consent obtained:  Verbal   Consent given by:  Patient    Risks discussed:  Infection, need for additional repair, nerve damage, pain, poor cosmetic result, poor wound healing, retained foreign body and vascular damage   Alternatives discussed:  No treatment Anesthesia (see MAR for exact dosages):    Anesthesia method:  Local infiltration   Local anesthetic:  Lidocaine 2% w/o epi Laceration details:    Location:  Hand   Hand location:  R hand, dorsum   Length (cm):  2  Depth (mm):  1 Repair type:    Repair type:  Simple Pre-procedure details:    Preparation:  Patient was prepped and draped in usual sterile fashion Exploration:    Hemostasis achieved with:  Direct pressure   Wound exploration: wound explored through full range of motion     Wound extent: no areolar tissue violation noted, no fascia violation noted, no foreign bodies/material noted, no muscle damage noted, no nerve damage noted, no tendon damage noted, no underlying fracture noted and no vascular damage noted   Treatment:    Area cleansed with:  Betadine (chlorhexidine)   Amount of cleaning:  Extensive   Irrigation solution:  Sterile saline Skin repair:    Repair method:  Sutures   Suture size:  4-0   Suture material:  Nylon (ethilon)   Suture technique:  Simple interrupted   Number of sutures:  4 Approximation:    Approximation:  Close Post-procedure details:    Dressing:  Sterile dressing   Patient tolerance of procedure:  Tolerated well, no immediate complications   (including critical care time)  Medications Ordered in ED Medications  Tdap (BOOSTRIX) injection 0.5 mL (has no administration in time range)  lidocaine (XYLOCAINE) 2 % (with pres) injection 400 mg (has no administration in time range)  lidocaine (XYLOCAINE) 2 % (with pres) injection (has no administration in time range)     Initial Impression / Assessment and Plan / ED Course  Given the nature of the injury and wearing gloves will cover the wound for pseudomonas.  I have prescribed cipro 500 mg  BID for 7 days.  This was sent to the pharmacy. The patient was informed blood pressure was elevated and to have this rechecked by his physician.  Patient informed EDP that he just started a new blood pressure medication.  Patient given instructions that if wound becomes infected (redness, drainage, streaking) to follow up with hand surgery.  This information was written on discharge paperwork with wound care instructions.  Patient was instructed to follow up at Urgent Care in 10 days for suture removal.    Austin Trevino was evaluated in Emergency Department on 10/19/2019 for the symptoms described in the history of present illness. He was evaluated in the context of the global COVID-19 pandemic, which necessitated consideration that the patient might be at risk for infection with the SARS-CoV-2 virus that causes COVID-19. Institutional protocols and algorithms that pertain to the evaluation of patients at risk for COVID-19 are in a state of rapid change based on information released by regulatory bodies including the CDC and federal and state organizations. These policies and algorithms were followed during the patient's care in the ED.  Final Clinical Impressions(s) / ED Diagnoses   Return for intractable cough, coughing up blood,fevers >100.4 unrelieved by medication, shortness of breath, intractable vomiting, chest pain, shortness of breath, weakness,numbness, changes in speech, facial asymmetry,abdominal pain, passing out,Inability to tolerate liquids or food, cough, altered mental status or any concerns. No signs of systemic illness or infection. The patient is nontoxic-appearing on exam and vital signs are within normal limits.   I have reviewed the triage vital signs and the nursing notes. Pertinent labs &imaging results that were available during my care of the patient were reviewed by me and considered in my medical decision making (see chart for details).  After history, exam, and  medical workup I feel the patient has been appropriately medically screened and is safe for discharge home. Pertinent diagnoses were discussed with the  patient. Patient was given return precautions   Kallista Pae, MD 10/19/19 Gayland Curry, Casmere Hollenbeck, MD 10/19/19 [id number]

## 2019-10-18 NOTE — ED Notes (Signed)
PT states had a tetanus shot 2 years ago. MD informed.

## 2019-10-19 ENCOUNTER — Encounter (HOSPITAL_BASED_OUTPATIENT_CLINIC_OR_DEPARTMENT_OTHER): Payer: Self-pay | Admitting: Emergency Medicine

## 2019-10-19 MED ORDER — CIPROFLOXACIN HCL 500 MG PO TABS: 500.0000 mg | ORAL_TABLET | Freq: Two times a day (BID) | ORAL | 0 refills | Status: DC

## 2019-10-19 NOTE — ED Notes (Signed)
DSD to right hand

## 2019-10-30 ENCOUNTER — Telehealth: Payer: Self-pay

## 2019-10-30 NOTE — Telephone Encounter (Signed)
Pt called and wanted acute appt for stitches on hand. Pt was called back by nurse and he stated it was not red, painful, and he was not having discharge. He has not gone to remove stitches yet and states most of them, if not all of them have came out. He states the wound is between his ring and pinky finger and in a bad spot that it will not close and stitches just will "keep coming out". Pt was advised to keep area clean, dry, and keep covered if open.   He made appt for 07/02/2019 as he has personal things he has to take care of tomorrow and cant come in. Pt was told if area became red, draining, painful, or ran fever he needed to call. He verbalizes understanding.

## 2019-11-01 ENCOUNTER — Other Ambulatory Visit: Payer: Self-pay

## 2019-11-01 ENCOUNTER — Ambulatory Visit (INDEPENDENT_AMBULATORY_CARE_PROVIDER_SITE_OTHER): Payer: BC Managed Care – PPO | Admitting: Family Medicine

## 2019-11-01 ENCOUNTER — Encounter: Payer: Self-pay | Admitting: Family Medicine

## 2019-11-01 VITALS — BP 136/92 | HR 66 | Temp 97.6°F | Resp 17 | Wt 231.4 lb

## 2019-11-01 DIAGNOSIS — Z5189 Encounter for other specified aftercare: Secondary | ICD-10-CM | POA: Diagnosis not present

## 2019-11-01 DIAGNOSIS — Z4802 Encounter for removal of sutures: Secondary | ICD-10-CM

## 2019-11-01 NOTE — Progress Notes (Signed)
This visit occurred during the SARS-CoV-2 public health emergency.  Safety protocols were in place, including screening questions prior to the visit, additional usage of staff PPE, and extensive cleaning of exam room while observing appropriate contact time as indicated for disinfecting solutions.    Austin Trevino , 1966/05/10, 53 y.o., male MRN: [id number] Patient Care Team    Relationship Specialty Notifications Start End  Ma Hillock, DO PCP - General Family Medicine  06/29/15   Clance, Armando Reichert, MD Consulting Physician Pulmonary Disease  03/25/15   Gatha Mayer, MD Consulting Physician Gastroenterology  10/04/19     Chief Complaint  Patient presents with  . Suture / Staple Removal    Pt has three stitches left, one has fallen out, on the rigfht hand between the 3rd and 4th kuncle      Subjective: Pt presents for an OV with complaints of mild redness of his right hand and remaining sutures after ED visit for laceration repair 10/18/2019 in which he sustained an injury on his gate. Repair completed with 4-0 nylon simple interrupted sutures x4 in the ED. He states he has 3 stitches that remain and would like his wound checked.   Depression screen [facility] 2/9 10/04/2019 03/28/2018  Decreased Interest 0 0  Down, Depressed, Hopeless 0 0  PHQ - 2 Score 0 0    No Known Allergies Social History   Social History Narrative   Divorced, 3 sons in middle school/HS [employer]).   Educ: BS management from [place].  Orig from [place].   Occupation: Nurse, learning disability for [employer].   No tob, alcohol intake occasional/social.   Past Medical History:  Diagnosis Date  . GERD (gastroesophageal reflux disease)   . Hx of colonic polyps - adenomas and ssp 02/18/2015  . Hypertension    treated "years ago" but he did some TLC/lost wt and was able to get off meds.  . Obesity, Class I, BMI 30-34.9   . OSA (obstructive sleep apnea) 01/2015   Suspected: eval by Dr. Gwenette Greet 02/2015, sleep study  recommended.   Past Surgical History:  Procedure Laterality Date  . COLONOSCOPY W/ POLYPECTOMY     Family History  Problem Relation Age of Onset  . Arthritis Mother   . Hypertension Mother   . Colon polyps Mother   . Hypertension Father   . Myelodysplastic syndrome Father   . Cancer Father   . Neuropathy Sister        amyloidosis  . Prostate cancer Paternal Grandfather   . Asthma Paternal Grandmother   . Colon cancer Neg Hx    Allergies as of 11/01/2019   No Known Allergies     Medication List       Accurate as of November 01, 2019  2:30 PM. If you have any questions, ask your nurse or doctor.        amLODipine 2.5 MG tablet Commonly known as: NORVASC Take 1 tablet (2.5 mg total) by mouth daily.   ciprofloxacin 500 MG tablet Commonly known as: Cipro Take 1 tablet (500 mg total) by mouth 2 (two) times daily. One po bid x 7 days   lisinopril 40 MG tablet Commonly known as: ZESTRIL Take 1 tablet (40 mg total) by mouth daily. Daily   multivitamin tablet Take 1 tablet by mouth daily.   pantoprazole 40 MG tablet Commonly known as: PROTONIX Take 1 tablet (40 mg total) by mouth daily. Will need follow up visit for further refills.  All past medical history, surgical history, allergies, family history, immunizations andmedications were updated in the EMR today and reviewed under the history and medication portions of their EMR.     ROS: Negative, with the exception of above mentioned in HPI   Objective:  BP (!) 136/92 (BP Location: Left Arm, Patient Position: Sitting, Cuff Size: Normal)   Pulse 66   Temp 97.6 F (36.4 C) (Temporal)   Resp 17   Wt 231 lb 6 oz (105 kg)   SpO2 98%   BMI 32.27 kg/m  Body mass index is 32.27 kg/m. Gen: Afebrile. No acute distress. Nontoxic in appearance, well developed, well nourished.  HENT: AT. [place].  Skin: 2cm granulating dehisced laceration right 5th MCP joint. x3 nylon sutures remain- pulled through to one side Neuro:   Alert. Oriented x3   No exam data present No results found. No results found for this or any previous visit (from the past 24 hour(s)).  Assessment/Plan: Austinn Vanwieren is a 53 y.o. male present for OV for  Encounter for post-traumatic wound check/Encounter for removal of sutures Dehisced repair- 14 days out.  Removed 3 of the remaining sutures.  Discussed options with him today. He agreed to mild cleansing of the wound edges with saline soak  and gauze scrub. 1/2 in steri strips and benzoin applied. Allow steris to fall off on their own and wound to heal by 2ndary granulation.  Patient tolerated wound care without complaints. Return precautions discussed. Pt was provided with return precautions.    Reviewed expectations re: course of current medical issues.  Discussed self-management of symptoms.  Outlined signs and symptoms indicating need for more acute intervention.  Patient verbalized understanding and all questions were answered.  Patient received an After-Visit Summary.    No orders of the defined types were placed in this encounter.  > 25 minutes spent with patient, >50% of time spent face to face     Note is dictated utilizing voice recognition software. Although note has been proof read prior to signing, occasional typographical errors still can be missed. If any questions arise, please do not hesitate to call for verification.   electronically signed by:  Howard Pouch, DO  [place]

## 2019-11-04 ENCOUNTER — Encounter: Payer: Self-pay | Admitting: Family Medicine

## 2019-11-08 ENCOUNTER — Encounter: Payer: Self-pay | Admitting: Family Medicine

## 2019-11-19 ENCOUNTER — Encounter: Payer: Self-pay | Admitting: Internal Medicine

## 2019-11-28 ENCOUNTER — Ambulatory Visit (AMBULATORY_SURGERY_CENTER): Payer: BC Managed Care – PPO

## 2019-11-28 DIAGNOSIS — Z86010 Personal history of colonic polyps: Secondary | ICD-10-CM

## 2019-11-28 DIAGNOSIS — Z1159 Encounter for screening for other viral diseases: Secondary | ICD-10-CM

## 2019-11-28 NOTE — Progress Notes (Signed)
No egg or soy allergy known to patient  No issues with past sedation with any surgeries  or procedures, no intubation problems  No diet pills per patient No home 02 use per patient  No blood thinners per patient  Pt denies issues with constipation  No A fib or A flutter  EMMI video sent to pt's e mail  Virtual visit over the phone, pt verb understanding.  Due to the COVID-19 pandemic we are asking patients to follow these guidelines. Please only bring one care partner. Please be aware that your care partner may wait in the car in the parking lot or if they feel like they will be too hot to wait in the car, they may wait in the lobby on the 4th floor. All care partners are required to wear a mask the entire time (we do not have any that we can provide them), they need to practice social distancing, and we will do a Covid check for all patient's and care partners when you arrive. Also we will check their temperature and your temperature. If the care partner waits in their car they need to stay in the parking lot the entire time and we will call them on their cell phone when the patient is ready for discharge so they can bring the car to the front of the building. Also all patient's will need to wear a mask into building.

## 2019-12-02 ENCOUNTER — Encounter: Payer: Self-pay | Admitting: Internal Medicine

## 2019-12-04 ENCOUNTER — Ambulatory Visit (INDEPENDENT_AMBULATORY_CARE_PROVIDER_SITE_OTHER): Payer: BC Managed Care – PPO

## 2019-12-04 DIAGNOSIS — Z1159 Encounter for screening for other viral diseases: Secondary | ICD-10-CM | POA: Diagnosis not present

## 2019-12-04 LAB — SARS CORONAVIRUS 2 (TAT 6-24 HRS): SARS Coronavirus 2: NEGATIVE

## 2019-12-06 ENCOUNTER — Telehealth: Payer: Self-pay | Admitting: Internal Medicine

## 2019-12-06 NOTE — Telephone Encounter (Signed)
Emailed prep instructions to [email or web address] Pt verified her received e mail

## 2019-12-09 ENCOUNTER — Encounter: Payer: Self-pay | Admitting: Internal Medicine

## 2019-12-09 ENCOUNTER — Other Ambulatory Visit: Payer: Self-pay

## 2019-12-09 ENCOUNTER — Ambulatory Visit (AMBULATORY_SURGERY_CENTER): Payer: BC Managed Care – PPO | Admitting: Internal Medicine

## 2019-12-09 VITALS — BP 136/77 | HR 61 | Temp 98.3°F | Resp 15 | Ht 73.0 in | Wt 231.0 lb

## 2019-12-09 DIAGNOSIS — Z86010 Personal history of colonic polyps: Secondary | ICD-10-CM | POA: Diagnosis not present

## 2019-12-09 DIAGNOSIS — Z1211 Encounter for screening for malignant neoplasm of colon: Secondary | ICD-10-CM | POA: Diagnosis not present

## 2019-12-09 MED ORDER — SODIUM CHLORIDE 0.9 % IV SOLN: 500.0000 mL | Freq: Once | INTRAVENOUS | Status: DC

## 2019-12-09 NOTE — Patient Instructions (Signed)
Handout on diverticulosis given to you today    YOU HAD AN ENDOSCOPIC PROCEDURE TODAY AT [facility]:   Refer to the procedure report that was given to you for any specific questions about what was found during the examination.  If the procedure report does not answer your questions, please call your gastroenterologist to clarify.  If you requested that your care partner not be given the details of your procedure findings, then the procedure report has been included in a sealed envelope for you to review at your convenience later.  YOU SHOULD EXPECT: Some feelings of bloating in the abdomen. Passage of more gas than usual.  Walking can help get rid of the air that was put into your GI tract during the procedure and reduce the bloating. If you had a lower endoscopy (such as a colonoscopy or flexible sigmoidoscopy) you may notice spotting of blood in your stool or on the toilet paper. If you underwent a bowel prep for your procedure, you may not have a normal bowel movement for a few days.  Please Note:  You might notice some irritation and congestion in your nose or some drainage.  This is from the oxygen used during your procedure.  There is no need for concern and it should clear up in a day or so.  SYMPTOMS TO REPORT IMMEDIATELY:   Following lower endoscopy (colonoscopy or flexible sigmoidoscopy):  Excessive amounts of blood in the stool  Significant tenderness or worsening of abdominal pains  Swelling of the abdomen that is new, acute  Fever of 100F or higher   Following upper endoscopy (EGD)  Vomiting of blood or coffee ground material  New chest pain or pain under the shoulder blades  Painful or persistently difficult swallowing  New shortness of breath  Fever of 100F or higher  Black, tarry-looking stools  For urgent or emergent issues, a gastroenterologist can be reached at any hour by calling [phone].   DIET:  We do recommend a small meal at first, but  then you may proceed to your regular diet.  Drink plenty of fluids but you should avoid alcoholic beverages for 24 hours.  ACTIVITY:  You should plan to take it easy for the rest of today and you should NOT DRIVE or use heavy machinery until tomorrow (because of the sedation medicines used during the test).    FOLLOW UP: Our staff will call the number listed on your records 48-72 hours following your procedure to check on you and address any questions or concerns that you may have regarding the information given to you following your procedure. If we do not reach you, we will leave a message.  We will attempt to reach you two times.  During this call, we will ask if you have developed any symptoms of COVID 19. If you develop any symptoms (ie: fever, flu-like symptoms, shortness of breath, cough etc.) before then, please call [phone].  If you test positive for Covid 19 in the 2 weeks post procedure, please call and report this information to [place].    If any biopsies were taken you will be contacted by phone or by letter within the next 1-3 weeks.  Please call us at [phone] if you have not heard about the biopsies in 3 weeks.    SIGNATURES/CONFIDENTIALITY: You and/or your care partner have signed paperwork which will be entered into your electronic medical record.  These signatures attest to the fact that that the information above  on your After Visit Summary has been reviewed and is understood.  Full responsibility of the confidentiality of this discharge information lies with you and/or your care-partner.

## 2019-12-09 NOTE — Op Note (Signed)
Austin Trevino Patient Name: Austin Trevino Procedure Date: 12/09/2019 9:06 AM MRN: [id number] Endoscopist: Gatha Mayer , MD Age: 54 Referring MD:  Date of Birth: 1966/07/20 Gender: Male Account #: [id number] Procedure:                Colonoscopy Indications:              Surveillance: Personal history of adenomatous                            polyps on last colonoscopy 3 years ago Medicines:                Propofol per Anesthesia, Monitored Anesthesia Care Procedure:                Pre-Anesthesia Assessment:                           - Prior to the procedure, a History and Physical                            was performed, and patient medications and                            allergies were reviewed. The patient's tolerance of                            previous anesthesia was also reviewed. The risks                            and benefits of the procedure and the sedation                            options and risks were discussed with the patient.                            All questions were answered, and informed consent                            was obtained. Prior Anticoagulants: The patient has                            taken no previous anticoagulant or antiplatelet                            agents. ASA Grade Assessment: II - A patient with                            mild systemic disease. After reviewing the risks                            and benefits, the patient was deemed in                            satisfactory condition to undergo the procedure.  After obtaining informed consent, the colonoscope                            was passed under direct vision. Throughout the                            procedure, the patient's blood pressure, pulse, and                            oxygen saturations were monitored continuously. The                            Colonoscope was introduced through the anus and                             advanced to the the cecum, identified by                            appendiceal orifice and ileocecal valve. The                            colonoscopy was performed without difficulty. The                            patient tolerated the procedure well. The quality                            of the bowel preparation was good. The bowel                            preparation used was Miralax via split dose                            instruction. The ileocecal valve, appendiceal                            orifice, and rectum were photographed. Scope In: 9:15:54 AM Scope Out: 9:29:41 AM Scope Withdrawal Time: 0 hours 11 minutes 18 seconds  Total Procedure Duration: 0 hours 13 minutes 47 seconds  Findings:                 Multiple small-mouthed diverticula were found in                            the sigmoid colon.                           The exam was otherwise without abnormality on                            direct and retroflexion views. Complications:            No immediate complications. Estimated Blood Loss:     Estimated blood loss: none. Impression:               - Diverticulosis in the sigmoid  colon.                           - The examination was otherwise normal on direct                            and retroflexion views.                           - No specimens collected. Recommendation:           - Patient has a contact number available for                            emergencies. The signs and symptoms of potential                            delayed complications were discussed with the                            patient. Return to normal activities tomorrow.                            Written discharge instructions were provided to the                            patient.                           - Resume previous diet.                           - Continue present medications.                           - Repeat colonoscopy in 5 years for surveillance. Gatha Mayer,  MD 12/09/2019 9:40:31 AM This report has been signed electronically.

## 2019-12-09 NOTE — Progress Notes (Signed)
PT taken to PACU. Monitors in place. VSS. Report given to RN. 

## 2019-12-09 NOTE — Progress Notes (Signed)
Pt's states no medical or surgical changes since previsit or office visit.  CW vitals, AG Iv and JB temps.

## 2019-12-11 ENCOUNTER — Telehealth: Payer: Self-pay | Admitting: *Deleted

## 2019-12-11 NOTE — Telephone Encounter (Signed)
Follow up call made, left message. 

## 2020-01-29 DIAGNOSIS — D3131 Benign neoplasm of right choroid: Secondary | ICD-10-CM | POA: Diagnosis not present

## 2020-05-19 IMAGING — CR DG HAND COMPLETE 3+V*R*
3 series · 3 of 3 positions shown · non-contrast
Comparison: None.

CLINICAL DATA: Laceration

EXAM:
RIGHT HAND - COMPLETE 3+ VIEW

[x hand pa right]
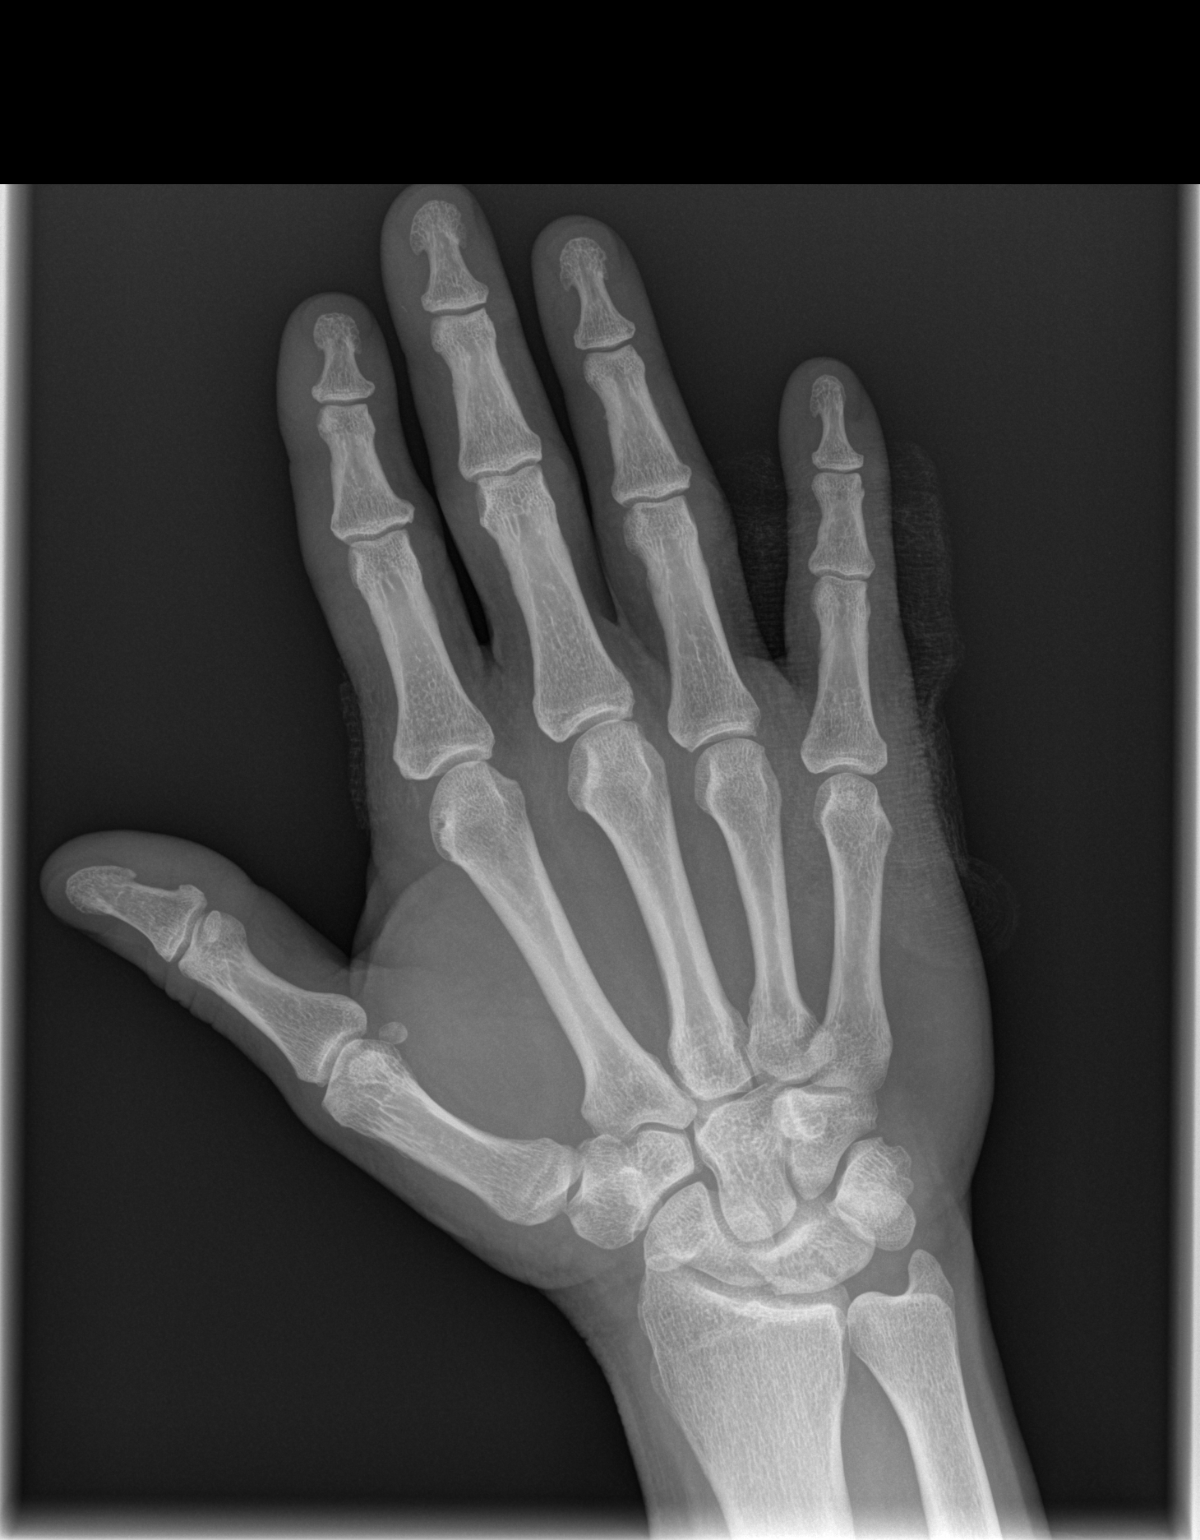

[x hand oblique right]
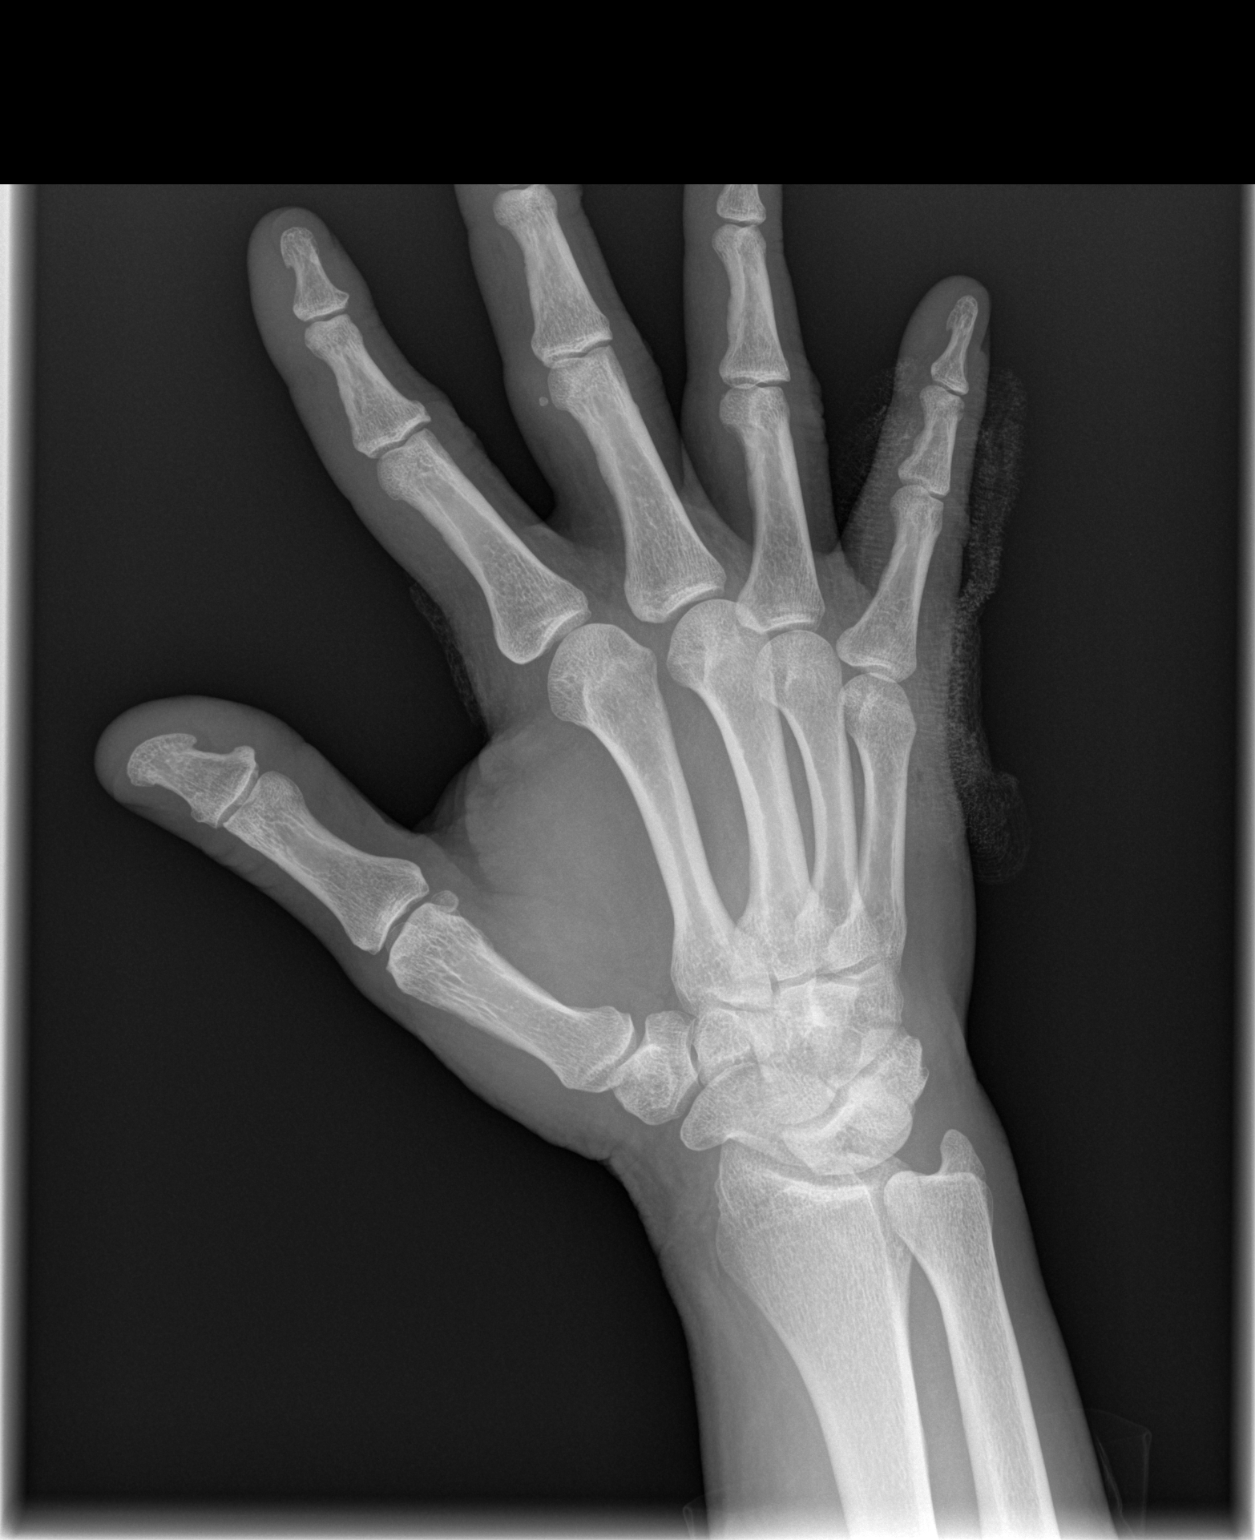

[x hand lat right]
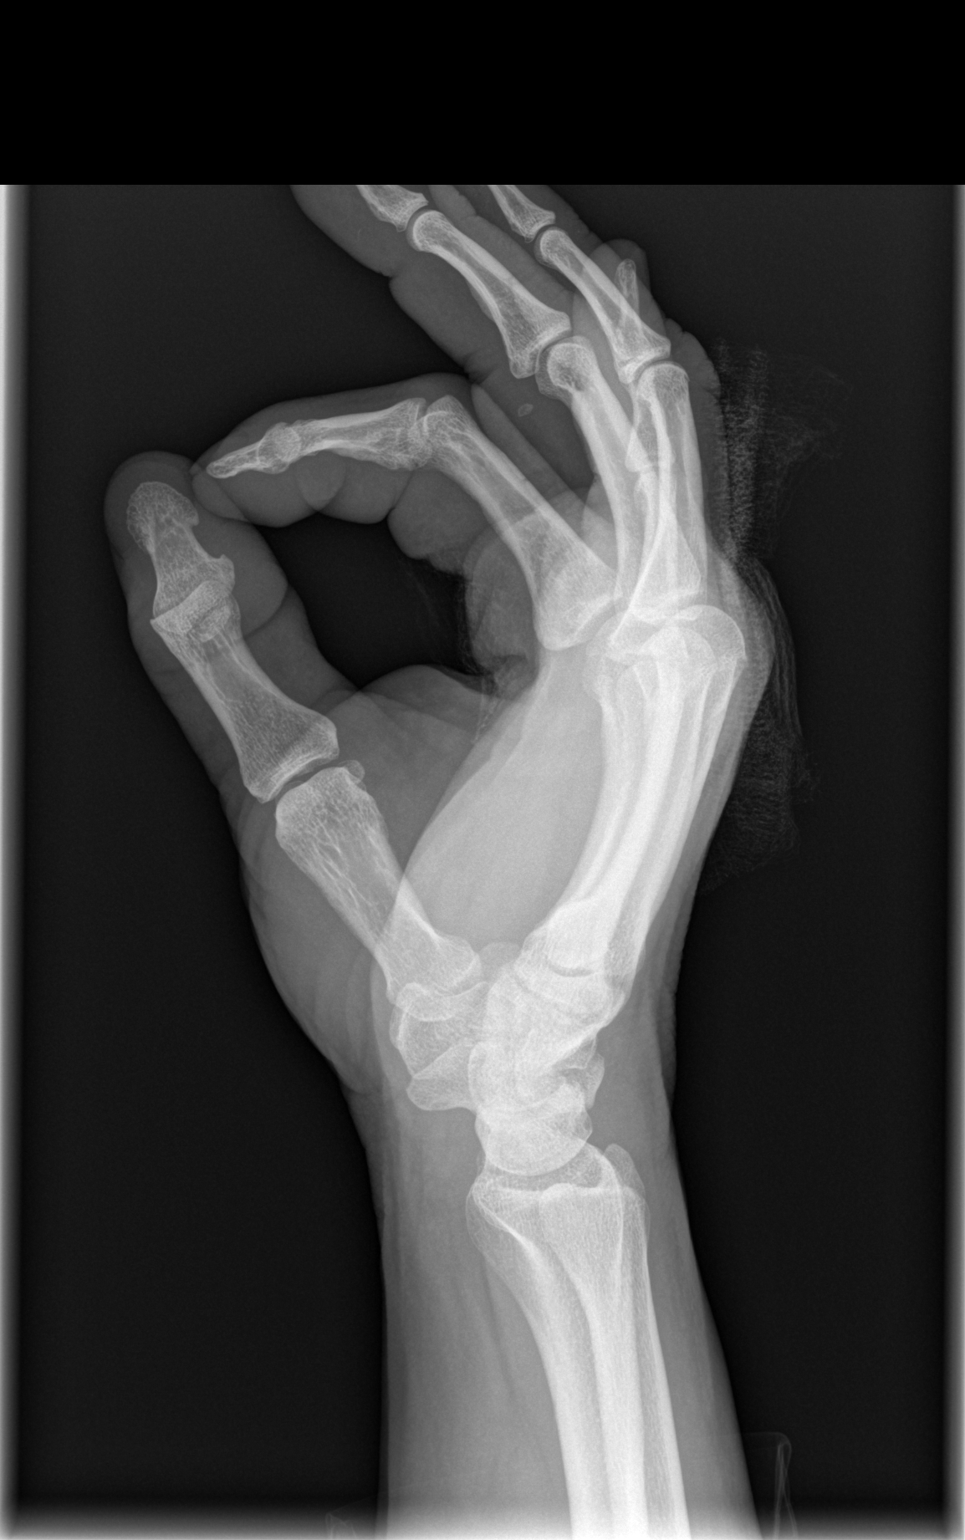

[3 of 3 positions shown; findings below may reference images not displayed]

FINDINGS: There is no evidence of fracture or dislocation. There is no
evidence of arthropathy or other focal bone abnormality. Soft
tissues are unremarkable.
IMPRESSION: Negative.

## 2020-06-26 ENCOUNTER — Telehealth: Payer: Self-pay

## 2020-06-26 ENCOUNTER — Other Ambulatory Visit: Payer: Self-pay | Admitting: Family Medicine

## 2020-06-26 DIAGNOSIS — M5416 Radiculopathy, lumbar region: Secondary | ICD-10-CM | POA: Diagnosis not present

## 2020-06-26 MED ORDER — LISINOPRIL 40 MG PO TABS: 40.0000 mg | ORAL_TABLET | Freq: Every day | ORAL | 0 refills | Status: DC

## 2020-06-26 NOTE — Telephone Encounter (Signed)
Pt was called and said that he rolled ankle about a week ago. Since then he has not only has ankle pain but has had severe hip pain with tingling and burning in thigh area since Tuesday. Pt was advised to seek a urgent care or ED to be evaluated as we have no appts and he may need imaging done today (with the results). Pt agreed and was going to call [facility] to see if their ortho urgent care was open

## 2020-06-26 NOTE — Telephone Encounter (Signed)
Patient "rolled" his ankle last weekend. It has gotten worse. He is requesting an appointment. I could not locate an appointment at another . Patient requests a call back from [facility].

## 2020-06-26 NOTE — Telephone Encounter (Signed)
RX printed instead of E scribe- Printed RX was shredded and electronic refill was sent

## 2020-06-26 NOTE — Addendum Note (Signed)
Addended by: Caroll Rancher L on: 06/26/2020 02:56 PM   Modules accepted: Orders

## 2020-07-02 DIAGNOSIS — M5441 Lumbago with sciatica, right side: Secondary | ICD-10-CM | POA: Diagnosis not present

## 2020-07-02 DIAGNOSIS — M5416 Radiculopathy, lumbar region: Secondary | ICD-10-CM | POA: Diagnosis not present

## 2020-07-07 DIAGNOSIS — M5416 Radiculopathy, lumbar region: Secondary | ICD-10-CM | POA: Diagnosis not present

## 2020-07-07 DIAGNOSIS — M5441 Lumbago with sciatica, right side: Secondary | ICD-10-CM | POA: Diagnosis not present

## 2020-07-08 ENCOUNTER — Telehealth: Payer: Self-pay

## 2020-07-08 NOTE — Telephone Encounter (Signed)
Office notes received from [facility] placed on Dr. Raoul Pitch desk for review

## 2020-07-14 DIAGNOSIS — M5441 Lumbago with sciatica, right side: Secondary | ICD-10-CM | POA: Diagnosis not present

## 2020-07-14 DIAGNOSIS — M5416 Radiculopathy, lumbar region: Secondary | ICD-10-CM | POA: Diagnosis not present

## 2020-07-16 DIAGNOSIS — M5416 Radiculopathy, lumbar region: Secondary | ICD-10-CM | POA: Diagnosis not present

## 2020-07-16 DIAGNOSIS — M5441 Lumbago with sciatica, right side: Secondary | ICD-10-CM | POA: Diagnosis not present

## 2020-07-17 DIAGNOSIS — M5136 Other intervertebral disc degeneration, lumbar region: Secondary | ICD-10-CM | POA: Diagnosis not present

## 2020-07-21 DIAGNOSIS — M5441 Lumbago with sciatica, right side: Secondary | ICD-10-CM | POA: Diagnosis not present

## 2020-07-21 DIAGNOSIS — M5416 Radiculopathy, lumbar region: Secondary | ICD-10-CM | POA: Diagnosis not present

## 2020-07-23 DIAGNOSIS — M5441 Lumbago with sciatica, right side: Secondary | ICD-10-CM | POA: Diagnosis not present

## 2020-07-23 DIAGNOSIS — M5416 Radiculopathy, lumbar region: Secondary | ICD-10-CM | POA: Diagnosis not present

## 2020-10-29 ENCOUNTER — Other Ambulatory Visit: Payer: Self-pay | Admitting: Family Medicine

## 2020-10-29 NOTE — Telephone Encounter (Signed)
Spoke with patient and offered to schedule [facility] or CPE appt with PCP due to last appointment, last Nov/Dec. He was ok with scheduling [facility] appt and wanted first available due to being out of BP med. Advised a 30 day supply could be sent to bridge until appt and further plan discussed during appt. He was not concerned with getting pantoprazole refilled. Pt scheduled for 12/7

## 2020-11-03 ENCOUNTER — Other Ambulatory Visit: Payer: Self-pay

## 2020-11-03 ENCOUNTER — Encounter: Payer: Self-pay | Admitting: Family Medicine

## 2020-11-03 ENCOUNTER — Ambulatory Visit (INDEPENDENT_AMBULATORY_CARE_PROVIDER_SITE_OTHER): Payer: BC Managed Care – PPO | Admitting: Family Medicine

## 2020-11-03 VITALS — BP 133/81 | HR 63 | Temp 98.5°F | Ht 73.0 in | Wt 230.0 lb

## 2020-11-03 DIAGNOSIS — E669 Obesity, unspecified: Secondary | ICD-10-CM

## 2020-11-03 DIAGNOSIS — I10 Essential (primary) hypertension: Secondary | ICD-10-CM | POA: Diagnosis not present

## 2020-11-03 LAB — CBC WITH DIFFERENTIAL/PLATELET
Basophils Absolute: 0 10*3/uL (ref 0.0–0.1)
Basophils Relative: 0.6 % (ref 0.0–3.0)
Eosinophils Absolute: 0.2 10*3/uL (ref 0.0–0.7)
Eosinophils Relative: 3.4 % (ref 0.0–5.0)
HCT: 46.5 % (ref 39.0–52.0)
Hemoglobin: 16.1 g/dL (ref 13.0–17.0)
Lymphocytes Relative: 20.3 % (ref 12.0–46.0)
Lymphs Abs: 1.4 10*3/uL (ref 0.7–4.0)
MCHC: 34.7 g/dL (ref 30.0–36.0)
MCV: 90.3 fl (ref 78.0–100.0)
Monocytes Absolute: 0.9 10*3/uL (ref 0.1–1.0)
Monocytes Relative: 12 % (ref 3.0–12.0)
Neutro Abs: 4.5 10*3/uL (ref 1.4–7.7)
Neutrophils Relative %: 63.7 % (ref 43.0–77.0)
Platelets: 204 10*3/uL (ref 150.0–400.0)
RBC: 5.15 Mil/uL (ref 4.22–5.81)
RDW: 13.2 % (ref 11.5–15.5)
WBC: 7.1 10*3/uL (ref 4.0–10.5)

## 2020-11-03 LAB — TSH: TSH: 1.79 u[IU]/mL (ref 0.35–4.50)

## 2020-11-03 LAB — LIPID PANEL
Cholesterol: 191 mg/dL (ref 0–200)
HDL: 44.2 mg/dL (ref >39.00)
LDL Cholesterol: 128 mg/dL — ABNORMAL HIGH (ref 0–99)
NonHDL: 147.06
Total CHOL/HDL Ratio: 4
Triglycerides: 95 mg/dL (ref 0.0–149.0)
VLDL: 19 mg/dL (ref 0.0–40.0)

## 2020-11-03 LAB — COMPREHENSIVE METABOLIC PANEL
ALT: 29 U/L (ref 0–53)
AST: 22 U/L (ref 0–37)
Albumin: 4.1 g/dL (ref 3.5–5.2)
Alkaline Phosphatase: 65 U/L (ref 39–117)
BUN: 14 mg/dL (ref 6–23)
CO2: 28 mEq/L (ref 19–32)
Calcium: 9.1 mg/dL (ref 8.4–10.5)
Chloride: 105 mEq/L (ref 96–112)
Creatinine, Ser: 1.07 mg/dL (ref 0.40–1.50)
GFR: 77.66 mL/min (ref >60.00)
Glucose, Bld: 90 mg/dL (ref 70–99)
Potassium: 3.9 mEq/L (ref 3.5–5.1)
Sodium: 138 mEq/L (ref 135–145)
Total Bilirubin: 1 mg/dL (ref 0.2–1.2)
Total Protein: 6.8 g/dL (ref 6.0–8.3)

## 2020-11-03 MED ORDER — AMLODIPINE BESYLATE 2.5 MG PO TABS: 2.5000 mg | ORAL_TABLET | Freq: Every day | ORAL | 1 refills | Status: DC

## 2020-11-03 MED ORDER — LISINOPRIL 40 MG PO TABS: 40.0000 mg | ORAL_TABLET | Freq: Every day | ORAL | 1 refills | Status: DC

## 2020-11-03 MED ORDER — PANTOPRAZOLE SODIUM 40 MG PO TBEC: DELAYED_RELEASE_TABLET | ORAL | 3 refills | Status: DC

## 2020-11-03 NOTE — Progress Notes (Signed)
Patient ID: Austin Trevino, male  DOB: March 18, 1966, 54 y.o.   MRN: [id number] Patient Care Team    Relationship Specialty Notifications Start End  Ma Hillock, DO PCP - General Family Medicine  06/29/15   Clance, Armando Reichert, MD Consulting Physician Pulmonary Disease  03/25/15   Gatha Mayer, MD Consulting Physician Gastroenterology  10/04/19     Chief Complaint  Patient presents with  . Follow-up    pt is not fasting    Subjective: Austin Trevino is a 54 y.o.  Male  present for [facility] Hypertension: Pt reports compliance  withlisinopril 40 mg and amlodipine 2.5 mg QD.Patient denies chest pain, shortness of breath, dizziness or lower extremity edema.  Diet: low sodium  Depression screen [facility] 2/9 11/03/2020 10/04/2019 03/28/2018  Decreased Interest 0 0 0  Down, Depressed, Hopeless 0 0 0  PHQ - 2 Score 0 0 0   No flowsheet data found.  Immunization History  Administered Date(s) Administered  . Influenza-Unspecified 08/28/2014, 07/29/2020  . PFIZER SARS-COV-2 Vaccination 02/07/2020, 02/28/2020  . Tdap 07/02/2017  . Zoster Recombinat (Shingrix) 10/04/2019    Past Medical History:  Diagnosis Date  . GERD (gastroesophageal reflux disease)   . Hx of colonic polyps - adenomas and ssp 02/18/2015  . Hypertension    treated "years ago" but he did some TLC/lost wt and was able to get off meds.  . Obesity, Class I, BMI 30-34.9   . OSA (obstructive sleep apnea) 01/2015   Suspected: eval by Dr. Gwenette Greet 02/2015, sleep study recommended.  . Sleep apnea    pt states no sleep study   No Known Allergies Past Surgical History:  Procedure Laterality Date  . COLONOSCOPY    . COLONOSCOPY W/ POLYPECTOMY     Family History  Problem Relation Age of Onset  . Arthritis Mother   . Hypertension Mother   . Colon polyps Mother   . Hypertension Father   . Myelodysplastic syndrome Father   . Cancer Father   . Neuropathy Sister        amyloidosis  . Prostate cancer Paternal Grandfather   .  Asthma Paternal Grandmother   . Colon cancer Neg Hx   . Rectal cancer Neg Hx   . Stomach cancer Neg Hx    Social History   Social History Narrative   Divorced, 3 sons in middle school/HS [employer]).   Educ: BS management from [employer] [place].  Orig from [place].   Occupation: Nurse, learning disability for [employer].   No tob, alcohol intake occasional/social.    Allergies as of 11/03/2020   No Known Allergies     Medication List       Accurate as of November 03, 2020 10:58 AM. If you have any questions, ask your nurse or doctor.        amLODipine 2.5 MG tablet Commonly known as: NORVASC TAKE ONE TABLET BY MOUTH DAILY   lisinopril 40 MG tablet Commonly known as: ZESTRIL Take 1 tablet (40 mg total) by mouth daily.   multivitamin tablet Take 1 tablet by mouth daily.   pantoprazole 40 MG tablet Commonly known as: PROTONIX TAKE ONE TABLET BY MOUTH DAILY. will FOLLOWING UP visit FOR further refills       All past medical history, surgical history, allergies, family history, immunizations andmedications were updated in the EMR today and reviewed under the history and medication portions of their EMR.     No results found for this or any previous visit (from  the past 2160 hour(s)).   ROS: 14 pt review of systems performed and negative (unless mentioned in an HPI)  Objective: BP 133/81   Pulse 63   Temp 98.5 F (36.9 C) (Oral)   Ht _0  (1.854 m)   Wt 230 lb (104.3 kg)   SpO2 95%   BMI 30.34 kg/m  Gen: Afebrile. No acute distress.  HENT: AT. [place].  Eyes:Pupils Equal Round Reactive to light, Extraocular movements intact,  Conjunctiva without redness, discharge or icterus. Neck/lymp/endocrine: Supple,no lymphadenopathy CV: RRR no murmur, no edema, +2/4 P posterior tibialis pulses Chest: CTAB, no wheeze or crackles Abd: Soft. NTND. BS present. no Masses palpated.  Neuro:  Normal gait. PERLA. EOMi. Alert. Oriented x3  Psych: Normal affect, dress and demeanor. Normal  speech. Normal thought content and judgment.   No exam data present  Assessment/plan: Austin Trevino is a 54 y.o. male present for  Essential hypertension/obesity -stable.  Diet and exercise encouraged - goal <130/80 - continue   lisinopril 40 mg qd.  - continue amlodipine 2.5 mg added today.  - diet and exercise discussed.  - cbc, tsh, cmp, lipid collected today - F/U 5  Mos for CPE  Gastroesophageal reflux disease without esophagitis Stable. Continue Protonix for him.   Return in about 5 months (around 04/03/2021) for CPE (30 min).    Orders Placed This Encounter  Procedures  . CBC w/Diff  . Comp Met (CMET)  . Lipid panel  . TSH     Electronically signed by: Howard Pouch, [place]

## 2020-11-03 NOTE — Patient Instructions (Signed)
Great to see you today.  I hope you have great holidays.   See ya back in about 5 months for your physical.

## 2021-04-05 ENCOUNTER — Other Ambulatory Visit: Payer: Self-pay

## 2021-04-06 ENCOUNTER — Ambulatory Visit (INDEPENDENT_AMBULATORY_CARE_PROVIDER_SITE_OTHER): Payer: BC Managed Care – PPO | Admitting: Family Medicine

## 2021-04-06 ENCOUNTER — Encounter: Payer: Self-pay | Admitting: Family Medicine

## 2021-04-06 VITALS — BP 134/83 | HR 67 | Temp 98.5°F | Ht 71.5 in | Wt 231.0 lb

## 2021-04-06 DIAGNOSIS — Z125 Encounter for screening for malignant neoplasm of prostate: Secondary | ICD-10-CM | POA: Diagnosis not present

## 2021-04-06 DIAGNOSIS — Z86010 Personal history of colonic polyps: Secondary | ICD-10-CM

## 2021-04-06 DIAGNOSIS — K219 Gastro-esophageal reflux disease without esophagitis: Secondary | ICD-10-CM

## 2021-04-06 DIAGNOSIS — E669 Obesity, unspecified: Secondary | ICD-10-CM

## 2021-04-06 DIAGNOSIS — I10 Essential (primary) hypertension: Secondary | ICD-10-CM | POA: Diagnosis not present

## 2021-04-06 DIAGNOSIS — Z0000 Encounter for general adult medical examination without abnormal findings: Secondary | ICD-10-CM | POA: Diagnosis not present

## 2021-04-06 DIAGNOSIS — Z23 Encounter for immunization: Secondary | ICD-10-CM

## 2021-04-06 DIAGNOSIS — Z131 Encounter for screening for diabetes mellitus: Secondary | ICD-10-CM | POA: Diagnosis not present

## 2021-04-06 LAB — COMPREHENSIVE METABOLIC PANEL
ALT: 30 U/L (ref 0–53)
AST: 21 U/L (ref 0–37)
Albumin: 4.3 g/dL (ref 3.5–5.2)
Alkaline Phosphatase: 66 U/L (ref 39–117)
BUN: 14 mg/dL (ref 6–23)
CO2: 32 mEq/L (ref 19–32)
Calcium: 9.6 mg/dL (ref 8.4–10.5)
Chloride: 102 mEq/L (ref 96–112)
Creatinine, Ser: 1.08 mg/dL (ref 0.40–1.50)
GFR: 76.57 mL/min (ref >60.00)
Glucose, Bld: 83 mg/dL (ref 70–99)
Potassium: 4.6 mEq/L (ref 3.5–5.1)
Sodium: 139 mEq/L (ref 135–145)
Total Bilirubin: 1 mg/dL (ref 0.2–1.2)
Total Protein: 7.1 g/dL (ref 6.0–8.3)

## 2021-04-06 LAB — CBC WITH DIFFERENTIAL/PLATELET
Basophils Absolute: 0 10*3/uL (ref 0.0–0.1)
Basophils Relative: 0.5 % (ref 0.0–3.0)
Eosinophils Absolute: 0.2 10*3/uL (ref 0.0–0.7)
Eosinophils Relative: 2.5 % (ref 0.0–5.0)
HCT: 46.8 % (ref 39.0–52.0)
Hemoglobin: 16 g/dL (ref 13.0–17.0)
Lymphocytes Relative: 16.1 % (ref 12.0–46.0)
Lymphs Abs: 1.3 10*3/uL (ref 0.7–4.0)
MCHC: 34.3 g/dL (ref 30.0–36.0)
MCV: 92.2 fl (ref 78.0–100.0)
Monocytes Absolute: 0.7 10*3/uL (ref 0.1–1.0)
Monocytes Relative: 9 % (ref 3.0–12.0)
Neutro Abs: 6 10*3/uL (ref 1.4–7.7)
Neutrophils Relative %: 71.9 % (ref 43.0–77.0)
Platelets: 210 10*3/uL (ref 150.0–400.0)
RBC: 5.08 Mil/uL (ref 4.22–5.81)
RDW: 13.9 % (ref 11.5–15.5)
WBC: 8.3 10*3/uL (ref 4.0–10.5)

## 2021-04-06 LAB — LIPID PANEL
Cholesterol: 194 mg/dL (ref 0–200)
HDL: 39.9 mg/dL (ref >39.00)
LDL Cholesterol: 125 mg/dL — ABNORMAL HIGH (ref 0–99)
NonHDL: 154.25
Total CHOL/HDL Ratio: 5
Triglycerides: 147 mg/dL (ref 0.0–149.0)
VLDL: 29.4 mg/dL (ref 0.0–40.0)

## 2021-04-06 LAB — TSH: TSH: 2.43 u[IU]/mL (ref 0.35–4.50)

## 2021-04-06 LAB — PSA: PSA: 2.06 ng/mL (ref 0.10–4.00)

## 2021-04-06 LAB — HEMOGLOBIN A1C: Hgb A1c MFr Bld: 5.3 % (ref 4.6–6.5)

## 2021-04-06 MED ORDER — PANTOPRAZOLE SODIUM 40 MG PO TBEC
DELAYED_RELEASE_TABLET | ORAL | 3 refills | Status: DC
Start: 2021-04-06 — End: 2022-05-04

## 2021-04-06 MED ORDER — AMLODIPINE BESYLATE 5 MG PO TABS
5.0000 mg | ORAL_TABLET | Freq: Every day | ORAL | 1 refills | Status: DC
Start: 2021-04-06 — End: 2021-09-20

## 2021-04-06 MED ORDER — LISINOPRIL 40 MG PO TABS
40.0000 mg | ORAL_TABLET | Freq: Every day | ORAL | 1 refills | Status: DC
Start: 2021-04-06 — End: 2021-10-11

## 2021-04-06 NOTE — Progress Notes (Signed)
This visit occurred during the SARS-CoV-2 public health emergency.  Safety protocols were in place, including screening questions prior to the visit, additional usage of staff PPE, and extensive cleaning of exam room while observing appropriate contact time as indicated for disinfecting solutions.    Patient ID: Austin Trevino, male  DOB: 08/11/1966, 55 y.o.   MRN: [id number] Patient Care Team    Relationship Specialty Notifications Start End  Ma Hillock, DO PCP - General Family Medicine  06/29/15   Clance, Armando Reichert, MD Consulting Physician Pulmonary Disease  03/25/15   Gatha Mayer, MD Consulting Physician Gastroenterology  10/04/19     Chief Complaint  Patient presents with  . Annual Exam    Pt is fasting    Subjective:  Austin Trevino is a 55 y.o. male present for CPE/CMC. All past medical history, surgical history, allergies, family history, immunizations, medications and social history were updated in the electronic medical record today. All recent labs, ED visits and hospitalizations within the last year were reviewed.  Health maintenance:  Colonoscopy: completed 11/2019, by Dr. Carlean Purl, . follow up 5 yrs Immunizations: tdap UTD 2018, Influenza UTD 2021 (encouraged yearly), Shingrix #2 today, covid x3 Infectious disease screening: HIV declined and Hep C completed  PSA: pt was counseled on prostate cancer screenings.  PSA:  Lab Results  Component Value Date   PSA 2.38 10/04/2019   PSA 1.38 01/21/2015   Assistive device: none Oxygen VZS:MOLM Patient has a Dental home. Hospitalizations/ED visits: reviewed  Hypertension: Pt reports compliance withlisinopril40 mg and amlodipine 2.5 mg QD. Patient denies chest pain, shortness of breath, dizziness or lower extremity edema.  Diet: low sodium   Depression screen [facility] 2/9 11/03/2020 10/04/2019 03/28/2018  Decreased Interest 0 0 0  Down, Depressed, Hopeless 0 0 0  PHQ - 2 Score 0 0 0   No flowsheet data found.       No flowsheet data found.  Immunization History  Administered Date(s) Administered  . Influenza-Unspecified 08/28/2014, 07/29/2020  . PFIZER(Purple Top)SARS-COV-2 Vaccination 02/07/2020, 02/28/2020, 11/05/2020  . Tdap 07/02/2017  . Zoster Recombinat (Shingrix) 10/04/2019, 04/06/2021   Past Medical History:  Diagnosis Date  . GERD (gastroesophageal reflux disease)   . Hx of colonic polyps - adenomas and ssp 02/18/2015  . Hypertension    treated "years ago" but he did some TLC/lost wt and was able to get off meds.  . Obesity, Class I, BMI 30-34.9   . OSA (obstructive sleep apnea) 01/2015   Suspected: eval by Dr. Gwenette Greet 02/2015, sleep study recommended.never completed.    No Known Allergies Past Surgical History:  Procedure Laterality Date  . COLONOSCOPY    . COLONOSCOPY W/ POLYPECTOMY     Family History  Problem Relation Age of Onset  . Arthritis Mother   . Hypertension Mother   . Colon polyps Mother   . Hypertension Father   . Myelodysplastic syndrome Father   . Cancer Father   . Neuropathy Sister        amyloidosis  . Prostate cancer Paternal Grandfather   . Asthma Paternal Grandmother   . Colon cancer Neg Hx   . Rectal cancer Neg Hx   . Stomach cancer Neg Hx    Social History   Social History Narrative   Divorced, 3 sons in middle school/[employer]).   Educ: BS management from [employer].  Orig from [employer].   Occupation: Nurse, learning disability for [employer].   No tob, alcohol intake occasional/social.  Allergies as of 04/06/2021   No Known Allergies     Medication List       Accurate as of Apr 06, 2021 10:33 AM. If you have any questions, ask your nurse or doctor.        amLODipine 5 MG tablet Commonly known as: NORVASC Take 1 tablet (5 mg total) by mouth daily. What changed:   medication strength  how much to take Changed by: Howard Pouch, DO   lisinopril 40 MG tablet Commonly known as: ZESTRIL Take 1 tablet (40 mg total) by mouth  daily.   multivitamin tablet Take 1 tablet by mouth daily.   pantoprazole 40 MG tablet Commonly known as: PROTONIX TAKE ONE TABLET BY MOUTH DAILY. will FOLLOWING UP visit FOR further refills      All past medical history, surgical history, allergies, family history, immunizations andmedications were updated in the EMR today and reviewed under the history and medication portions of their EMR.     No results found for this or any previous visit (from the past 2160 hour(s)).    ROS: 14 pt review of systems performed and negative (unless mentioned in an HPI)  Objective: BP 134/83   Pulse 67   Temp 98.5 F (36.9 C) (Oral)   Ht 5' 11.5" (1.816 m)   Wt 231 lb (104.8 kg)   SpO2 96%   BMI 31.77 kg/m  Gen: Afebrile. No acute distress. Nontoxic in appearance, well-developed, well-nourished,  Very pleasant male.  HENT: AT. [place]. Bilateral TM visualized and normal in appearance, normal external auditory canal. MMM, no oral lesions, adequate dentition. Bilateral nares within normal limits. Throat without erythema, ulcerations or exudates. no Cough on exam, no hoarseness on exam. Eyes:Pupils Equal Round Reactive to light, Extraocular movements intact,  Conjunctiva without redness, discharge or icterus. Neck/lymp/endocrine: Supple,no lymphadenopathy, no thyromegaly CV: RRR no murmur, no edema, +2/4 P posterior tibialis pulses.  Chest: CTAB, no wheeze, rhonchi or crackles. normal Respiratory effort. good Air movement. Abd: Soft. flat. NTND. BS present. no Masses palpated. No hepatosplenomegaly. No rebound tenderness or guarding. Skin: no rashes, purpura or petechiae. Warm and well-perfused. Skin intact. Neuro/Msk: Normal gait. PERLA. EOMi. Alert. Oriented x3.  Cranial nerves II through XII intact. Muscle strength 5/5 upper/lower extremity. DTRs equal bilaterally. Psych: Normal affect, dress and demeanor. Normal speech. Normal thought content and judgment.  No exam data  present  Assessment/plan: Von Quintanar is a 55 y.o. male present for CPE Gastroesophageal reflux disease without esophagitis/long term PPI Stable.  Continue protonix 40 mg qd Next labs monitor b12, vit d and mag for long term PPI use. Pt takes a MV.  Essential hypertension/ Obesity (BMI 30-39.9) Mildly above goal Increase amlodipine to 5 mg qd Continue lisinopril 40 mg qd.  - Comprehensive metabolic panel - CBC with Differential/Platelet - Lipid panel - TSH Cbc, cmp, tsh, lipids F/u 5.5 mos.  Diabetes mellitus screening - Hemoglobin A1c Prostate cancer screening - PSA Need for zoster vaccine - Varicella-zoster vaccine IM  Routine general medical examination at a health care facility Patient was encouraged to exercise greater than 150 minutes a week. Patient was encouraged to choose a diet filled with fresh fruits and vegetables, and lean meats. AVS provided to patient today for education/recommendation on gender specific health and safety maintenance. Colonoscopy: completed 11/2019, by Dr. Carlean Purl, . follow up 5 yrs Immunizations: tdap UTD 2018, Influenza UTD 2021 (encouraged yearly), Shingrix #2 today, covid x3 Infectious disease screening: HIV declined and Hep C completed  PSA:  pt was counseled on prostate cancer screenings.   Return in about 6 months (around 09/27/2021) for [place] (30 min) and 1 yr cpe.  Orders Placed This Encounter  Procedures  . Varicella-zoster vaccine IM  . Comprehensive metabolic panel  . CBC with Differential/Platelet  . Hemoglobin A1c  . Lipid panel  . PSA  . TSH    Meds ordered this encounter  Medications  . lisinopril (ZESTRIL) 40 MG tablet    Sig: Take 1 tablet (40 mg total) by mouth daily.    Dispense:  90 tablet    Refill:  1  . amLODipine (NORVASC) 5 MG tablet    Sig: Take 1 tablet (5 mg total) by mouth daily.    Dispense:  90 tablet    Refill:  1  . pantoprazole (PROTONIX) 40 MG tablet    Sig: TAKE ONE TABLET BY MOUTH DAILY.  will FOLLOWING UP visit FOR further refills    Dispense:  90 tablet    Refill:  3   Referral Orders  No referral(s) requested today     Note is dictated utilizing voice recognition software. Although note has been proof read prior to signing, occasional typographical errors still can be missed. If any questions arise, please do not hesitate to call for verification.  Electronically signed by: Howard Pouch, DO [place]

## 2021-04-06 NOTE — Patient Instructions (Signed)
Great to see you today.  I have refilled the medication(s) we provide.   If labs were collected, we will inform you of lab results once received either by echart message or telephone call.   - echart message- for normal results that have been seen by the patient already.   - telephone call: abnormal results or if patient has not viewed results in their echart.    Health Maintenance, Male Adopting a healthy lifestyle and getting preventive care are important in promoting health and wellness. Ask your health care provider about:  The right schedule for you to have regular tests and exams.  Things you can do on your own to prevent diseases and keep yourself healthy. What should I know about diet, weight, and exercise? Eat a healthy diet  Eat a diet that includes plenty of vegetables, fruits, low-fat dairy products, and lean protein.  Do not eat a lot of foods that are high in solid fats, added sugars, or sodium.   Maintain a healthy weight Body mass index (BMI) is a measurement that can be used to identify possible weight problems. It estimates body fat based on height and weight. Your health care provider can help determine your BMI and help you achieve or maintain a healthy weight. Get regular exercise Get regular exercise. This is one of the most important things you can do for your health. Most adults should:  Exercise for at least 150 minutes each week. The exercise should increase your heart rate and make you sweat (moderate-intensity exercise).  Do strengthening exercises at least twice a week. This is in addition to the moderate-intensity exercise.  Spend less time sitting. Even light physical activity can be beneficial. Watch cholesterol and blood lipids Have your blood tested for lipids and cholesterol at 57 years of age, then have this test every 5 years. You may need to have your cholesterol levels checked more often if:  Your lipid or cholesterol levels are high.  You  are older than 57 years of age.  You are at high risk for heart disease. What should I know about cancer screening? Many types of cancers can be detected early and may often be prevented. Depending on your health history and family history, you may need to have cancer screening at various ages. This may include screening for:  Colorectal cancer.  Prostate cancer.  Skin cancer.  Lung cancer. What should I know about heart disease, diabetes, and high blood pressure? Blood pressure and heart disease  High blood pressure causes heart disease and increases the risk of stroke. This is more likely to develop in people who have high blood pressure readings, are of African descent, or are overweight.  Talk with your health care provider about your target blood pressure readings.  Have your blood pressure checked: ? Every 3-5 years if you are 18-39 years of age. ? Every year if you are 40 years old or older.  If you are between the ages of 65 and 75 and are a current or former smoker, ask your health care provider if you should have a one-time screening for abdominal aortic aneurysm (AAA). Diabetes Have regular diabetes screenings. This checks your fasting blood sugar level. Have the screening done:  Once every three years after age 45 if you are at a normal weight and have a low risk for diabetes.  More often and at a younger age if you are overweight or have a high risk for diabetes. What should I know about   preventing infection? Hepatitis B If you have a higher risk for hepatitis B, you should be screened for this virus. Talk with your health care provider to find out if you are at risk for hepatitis B infection. Hepatitis C Blood testing is recommended for:  Everyone born from 1945 through 1965.  Anyone with known risk factors for hepatitis C. Sexually transmitted infections (STIs)  You should be screened each year for STIs, including gonorrhea and chlamydia, if: ? You are  sexually active and are younger than 57 years of age. ? You are older than 57 years of age and your health care provider tells you that you are at risk for this type of infection. ? Your sexual activity has changed since you were last screened, and you are at increased risk for chlamydia or gonorrhea. Ask your health care provider if you are at risk.  Ask your health care provider about whether you are at high risk for HIV. Your health care provider may recommend a prescription medicine to help prevent HIV infection. If you choose to take medicine to prevent HIV, you should first get tested for HIV. You should then be tested every 3 months for as long as you are taking the medicine. Follow these instructions at home: Lifestyle  Do not use any products that contain nicotine or tobacco, such as cigarettes, e-cigarettes, and chewing tobacco. If you need help quitting, ask your health care provider.  Do not use street drugs.  Do not share needles.  Ask your health care provider for help if you need support or information about quitting drugs. Alcohol use  Do not drink alcohol if your health care provider tells you not to drink.  If you drink alcohol: ? Limit how much you have to 0-2 drinks a day. ? Be aware of how much alcohol is in your drink. In the [place] one drink equals one 12 oz bottle of beer (355 mL), one 5 oz glass of wine (148 mL), or one 1 oz glass of hard liquor (44 mL). General instructions  Schedule regular health, dental, and eye exams.  Stay current with your vaccines.  Tell your health care provider if: ? You often feel depressed. ? You have ever been abused or do not feel safe at home. Summary  Adopting a healthy lifestyle and getting preventive care are important in promoting health and wellness.  Follow your health care provider's instructions about healthy diet, exercising, and getting tested or screened for diseases.  Follow your health care provider's  instructions on monitoring your cholesterol and blood pressure. This information is not intended to replace advice given to you by your health care provider. Make sure you discuss any questions you have with your health care provider. Document Revised: 11/07/2018 Document Reviewed: 11/07/2018 Elsevier Patient Education  2021 Elsevier Inc.  

## 2021-06-29 ENCOUNTER — Encounter (HOSPITAL_BASED_OUTPATIENT_CLINIC_OR_DEPARTMENT_OTHER): Payer: Self-pay

## 2021-06-29 ENCOUNTER — Emergency Department (HOSPITAL_BASED_OUTPATIENT_CLINIC_OR_DEPARTMENT_OTHER)
Admission: EM | Admit: 2021-06-29 | Discharge: 2021-06-29 | Disposition: A | Discharge status: Home / Self Care | Payer: BC Managed Care – PPO | Attending: Emergency Medicine | Admitting: Emergency Medicine

## 2021-06-29 ENCOUNTER — Other Ambulatory Visit: Payer: Self-pay

## 2021-06-29 DIAGNOSIS — I10 Essential (primary) hypertension: Secondary | ICD-10-CM | POA: Insufficient documentation

## 2021-06-29 DIAGNOSIS — Z79899 Other long term (current) drug therapy: Secondary | ICD-10-CM | POA: Insufficient documentation

## 2021-06-29 DIAGNOSIS — R310 Gross hematuria: Secondary | ICD-10-CM | POA: Insufficient documentation

## 2021-06-29 LAB — URINALYSIS, MICROSCOPIC (REFLEX): RBC / HPF: >50 RBC/hpf (ref 0–5)

## 2021-06-29 LAB — URINALYSIS, ROUTINE W REFLEX MICROSCOPIC: pH: 6 (ref 5.0–8.0)

## 2021-06-29 NOTE — ED Provider Notes (Signed)
[facility] EMERGENCY DEPARTMENT Provider Note   CSN: [id number] Arrival date & time: 06/29/21  1941     History Chief Complaint  Patient presents with   Hematuria    Austin Trevino is a 55 y.o. male.  55 yo M with a chief complaints of hematuria.  This was noticed about 3 hours ago.  Grossly bloody.  He denies any fevers or chills.  Denies dysuria.  He has some very mild discomfort that he thinks he could be imagining on his left side.  He denies any trauma.  Denies blood thinner use.  The history is provided by the patient.  Hematuria This is a new problem. The current episode started 1 to 2 hours ago. The problem occurs constantly. The problem has not changed since onset.Pertinent negatives include no chest pain, no abdominal pain, no headaches and no shortness of breath. Nothing aggravates the symptoms. Nothing relieves the symptoms. He has tried nothing for the symptoms. The treatment provided no relief.      Past Medical History:  Diagnosis Date   GERD (gastroesophageal reflux disease)    Hx of colonic polyps - adenomas and ssp 02/18/2015   Hypertension    treated "years ago" but he did some TLC/lost wt and was able to get off meds.   Obesity, Class I, BMI 30-34.9    OSA (obstructive sleep apnea) 01/2015   Suspected: eval by Dr. Gwenette Greet 02/2015, sleep study recommended.never completed.     Patient Active Problem List   Diagnosis Date Noted   Routine general medical examination at a health care facility 04/06/2021   GERD (gastroesophageal reflux disease) 10/04/2019   Obesity (BMI 30-39.9) 04/01/2019   Essential hypertension 01/07/2019   Hx of colonic polyps - adenomas and ssp 02/18/2015    Past Surgical History:  Procedure Laterality Date   COLONOSCOPY     COLONOSCOPY W/ POLYPECTOMY         Family History  Problem Relation Age of Onset   Arthritis Mother    Hypertension Mother    Colon polyps Mother    Hypertension Father    Myelodysplastic  syndrome Father    Cancer Father    Neuropathy Sister        amyloidosis   Prostate cancer Paternal Grandfather    Asthma Paternal Grandmother    Colon cancer Neg Hx    Rectal cancer Neg Hx    Stomach cancer Neg Hx     Social History   Tobacco Use   Smoking status: Never   Smokeless tobacco: Never  Vaping Use   Vaping Use: Never used  Substance Use Topics   Alcohol use: No    Alcohol/week: 0.0 standard drinks    Comment: socially but rare   Drug use: No    Home Medications Prior to Admission medications   Medication Sig Start Date End Date Taking? Authorizing Provider  amLODipine (NORVASC) 5 MG tablet Take 1 tablet (5 mg total) by mouth daily. 04/06/21   Kuneff, Renee A, DO  lisinopril (ZESTRIL) 40 MG tablet Take 1 tablet (40 mg total) by mouth daily. 04/06/21   Kuneff, Renee A, DO  Multiple Vitamin (MULTIVITAMIN) tablet Take 1 tablet by mouth daily.    [provider]  pantoprazole (PROTONIX) 40 MG tablet TAKE ONE TABLET BY MOUTH DAILY. will FOLLOWING UP visit FOR further refills 04/06/21   Kuneff, Renee A, DO    Allergies    Patient has no known allergies.  Review of Systems   Review  of Systems  Constitutional:  Negative for chills and fever.  HENT:  Negative for congestion and facial swelling.   Eyes:  Negative for discharge and visual disturbance.  Respiratory:  Negative for shortness of breath.   Cardiovascular:  Negative for chest pain and palpitations.  Gastrointestinal:  Negative for abdominal pain, diarrhea and vomiting.  Genitourinary:  Positive for hematuria.  Musculoskeletal:  Negative for arthralgias and myalgias.  Skin:  Negative for color change and rash.  Neurological:  Negative for tremors, syncope and headaches.  Psychiatric/Behavioral:  Negative for confusion and dysphoric mood.    Physical Exam Updated Vital Signs BP (!) 185/116 (BP Location: Right Arm)   Pulse 64   Temp 98.6 F (37 C) (Oral)   Resp 20   Ht 6' (1.829 m)   Wt 105.2  kg   SpO2 97%   BMI 31.46 kg/m   Physical Exam Vitals and nursing note reviewed.  Constitutional:      Appearance: He is well-developed.  HENT:     Head: Normocephalic and atraumatic.  Eyes:     Pupils: Pupils are equal, round, and reactive to light.  Neck:     Vascular: No JVD.  Cardiovascular:     Rate and Rhythm: Normal rate and regular rhythm.     Heart sounds: No murmur heard.   No friction rub. No gallop.  Pulmonary:     Effort: No respiratory distress.     Breath sounds: No wheezing.  Abdominal:     General: There is no distension.     Tenderness: There is no abdominal tenderness. There is no guarding or rebound.  Musculoskeletal:        General: Normal range of motion.     Cervical back: Normal range of motion and neck supple.  Skin:    Coloration: Skin is not pale.     Findings: No rash.  Neurological:     Mental Status: He is alert and oriented to person, place, and time.  Psychiatric:        Behavior: Behavior normal.    ED Results / Procedures / Treatments   Labs (all labs ordered are listed, but only abnormal results are displayed) Labs Reviewed  URINALYSIS, ROUTINE W REFLEX MICROSCOPIC - Abnormal; Notable for the following components:      Result Value   Color, Urine RED (*)    APPearance TURBID (*)    Glucose, UA   (*)    Value: TEST NOT REPORTED DUE TO COLOR INTERFERENCE OF URINE PIGMENT   Hgb urine dipstick   (*)    Value: TEST NOT REPORTED DUE TO COLOR INTERFERENCE OF URINE PIGMENT   Bilirubin Urine   (*)    Value: TEST NOT REPORTED DUE TO COLOR INTERFERENCE OF URINE PIGMENT   Ketones, ur   (*)    Value: TEST NOT REPORTED DUE TO COLOR INTERFERENCE OF URINE PIGMENT   Protein, ur   (*)    Value: TEST NOT REPORTED DUE TO COLOR INTERFERENCE OF URINE PIGMENT   Nitrite   (*)    Value: TEST NOT REPORTED DUE TO COLOR INTERFERENCE OF URINE PIGMENT   Leukocytes,Ua   (*)    Value: TEST NOT REPORTED DUE TO COLOR INTERFERENCE OF URINE PIGMENT   All  other components within normal limits  URINALYSIS, MICROSCOPIC (REFLEX) - Abnormal; Notable for the following components:   Bacteria, UA RARE (*)    All other components within normal limits    EKG None  Radiology No  results found.  Procedures Procedures   Medications Ordered in ED Medications - No data to display  ED Course  I have reviewed the triage vital signs and the nursing notes.  Pertinent labs & imaging results that were available during my care of the patient were reviewed by me and considered in my medical decision making (see chart for details).    MDM Rules/Calculators/A&P                           55 yo M with a chief complaints of gross hematuria.  No difficulty with urination no significant flank pain no fevers.  Urine without obvious infection.  We will have him follow-up with urology.  10:44 PM:  I have discussed the diagnosis/risks/treatment options with the patient and believe the pt to be eligible for discharge home to follow-up with Urology. We also discussed returning to the ED immediately if new or worsening sx occur. We discussed the sx which are most concerning (e.g., sudden worsening pain, fever, inability to tolerate by mouth) that necessitate immediate return. Medications administered to the patient during their visit and any new prescriptions provided to the patient are listed below.  Medications given during this visit Medications - No data to display   The patient appears reasonably screen and/or stabilized for discharge and I doubt any other medical condition or other [facility] requiring further screening, evaluation, or treatment in the ED at this time prior to discharge.   Final Clinical Impression(s) / ED Diagnoses Final diagnoses:  Gross hematuria    Rx / DC Orders ED Discharge Orders     None        Deno Etienne, DO 06/29/21 2244

## 2021-06-29 NOTE — ED Triage Notes (Signed)
Pt reports 2 episode of blood in urine in the last hour-NAD-steady gait

## 2021-06-29 NOTE — Discharge Instructions (Addendum)
Return for worsening flank pain if you get a fever or if you are unable to urinate.  Otherwise follow-up with the urologist in the office.

## 2021-07-01 DIAGNOSIS — R310 Gross hematuria: Secondary | ICD-10-CM | POA: Diagnosis not present

## 2021-07-01 DIAGNOSIS — R351 Nocturia: Secondary | ICD-10-CM | POA: Diagnosis not present

## 2021-07-01 DIAGNOSIS — N401 Enlarged prostate with lower urinary tract symptoms: Secondary | ICD-10-CM | POA: Diagnosis not present

## 2021-07-12 DIAGNOSIS — R310 Gross hematuria: Secondary | ICD-10-CM | POA: Diagnosis not present

## 2021-07-30 ENCOUNTER — Encounter: Payer: Self-pay | Admitting: Family Medicine

## 2021-07-30 ENCOUNTER — Ambulatory Visit: Payer: BC Managed Care – PPO | Admitting: Family Medicine

## 2021-07-30 ENCOUNTER — Other Ambulatory Visit: Payer: Self-pay

## 2021-07-30 VITALS — BP 131/75 | HR 67 | Temp 98.6°F | Resp 16 | Ht 71.5 in | Wt 230.8 lb

## 2021-07-30 DIAGNOSIS — R2240 Localized swelling, mass and lump, unspecified lower limb: Secondary | ICD-10-CM | POA: Diagnosis not present

## 2021-07-30 DIAGNOSIS — M71379 Other bursal cyst, unspecified ankle and foot: Secondary | ICD-10-CM | POA: Diagnosis not present

## 2021-07-30 NOTE — Progress Notes (Signed)
OFFICE VISIT  07/30/2021  CC:  Chief Complaint  Patient presents with   Growth on 2nd toe    Right; 2 months. He has cut it open twice but no pus comes out. Area is tender     HPI:    Patient is a 55 y.o. Caucasian male who presents for "check growth on right 2nd toe". Painless nodule noted about 1 mo ago, flesh colored, over R 2nd toe IP jt medial to the midline. He "popped it open" a couple of times and very viscous clear fluid came out--just a bee bee sized amount.  No probs with foot or toe pain, no impairment of ROM. No injury prior.   Past Medical History:  Diagnosis Date   GERD (gastroesophageal reflux disease)    Hx of colonic polyps - adenomas and ssp 02/18/2015   Hypertension    treated "years ago" but he did some TLC/lost wt and was able to get off meds.   Obesity, Class I, BMI 30-34.9    OSA (obstructive sleep apnea) 01/2015   Suspected: eval by Dr. Gwenette Greet 02/2015, sleep study recommended.never completed.     Past Surgical History:  Procedure Laterality Date   COLONOSCOPY     COLONOSCOPY W/ POLYPECTOMY      Outpatient Medications Prior to Visit  Medication Sig Dispense Refill   amLODipine (NORVASC) 5 MG tablet Take 1 tablet (5 mg total) by mouth daily. 90 tablet 1   lisinopril (ZESTRIL) 40 MG tablet Take 1 tablet (40 mg total) by mouth daily. 90 tablet 1   Multiple Vitamin (MULTIVITAMIN) tablet Take 1 tablet by mouth daily.     pantoprazole (PROTONIX) 40 MG tablet TAKE ONE TABLET BY MOUTH DAILY. will FOLLOWING UP visit FOR further refills 90 tablet 3   tamsulosin (FLOMAX) 0.4 MG CAPS capsule Take 0.4 mg by mouth daily.     Facility-Administered Medications Prior to Visit  Medication Dose Route Frequency Provider Last Rate Last Admin   0.9 %  sodium chloride infusion  500 mL Intravenous Once Gatha Mayer, MD        No Known Allergies  ROS As per HPI  PE: Vitals with BMI 07/30/2021 06/29/2021 06/29/2021  Height 5' 11.5" - -  Weight 230 lbs 13 oz - -  BMI  [id number] - -  Systolic [id number]  Diastolic 75 [id number]  Pulse 67 64 64     Gen: Alert, well appearing.  Patient is oriented to person, place, time, and situation. AFFECT: pleasant, lucid thought and speech. There is a bee bee sized slightly translucent papule that feels rubbery/firm located on R foot 2nd toe at the IP jt level--medial to the midline.  No erythema, soft tissue swelling, or tenderness.  No verrucous features. ROM of toe intact---the lesion is clearly not connected to a tendon.  LABS:    Chemistry      Component Value Date/Time   NA 139 04/06/2021 0936   K 4.6 04/06/2021 0936   CL 102 04/06/2021 0936   CO2 32 04/06/2021 0936   BUN 14 04/06/2021 0936   CREATININE 1.08 04/06/2021 0936      Component Value Date/Time   CALCIUM 9.6 04/06/2021 0936   ALKPHOS 66 04/06/2021 0936   AST 21 04/06/2021 0936   ALT 30 04/06/2021 0936   BILITOT 1.0 04/06/2021 0936     Lab Results  Component Value Date   WBC 8.3 04/06/2021   HGB 16.0 04/06/2021   HCT 46.8 04/06/2021  MCV 92.2 04/06/2021   PLT 210.0 04/06/2021   Lab Results  Component Value Date   HGBA1C 5.3 04/06/2021   IMPRESSION AND PLAN:  Toe lesion, suspect synovial cyst.  Asymptomatic. Reassured pt.  Recommended he leave it alone, don't try to pop it anymore. Offered derm referral but he was comfortable with watchful waiting approach.  Signs/symptoms to call or return for were reviewed and pt expressed understanding.  An After Visit Summary was printed and given to the patient.  FOLLOW UP: No follow-ups on file.  Signed:  Crissie Sickles, MD           07/30/2021

## 2021-08-06 DIAGNOSIS — N401 Enlarged prostate with lower urinary tract symptoms: Secondary | ICD-10-CM | POA: Diagnosis not present

## 2021-08-06 DIAGNOSIS — R310 Gross hematuria: Secondary | ICD-10-CM | POA: Diagnosis not present

## 2021-08-06 DIAGNOSIS — R351 Nocturia: Secondary | ICD-10-CM | POA: Diagnosis not present

## 2021-08-09 DIAGNOSIS — M71371 Other bursal cyst, right ankle and foot: Secondary | ICD-10-CM | POA: Diagnosis not present

## 2021-08-10 DIAGNOSIS — M7130 Other bursal cyst, unspecified site: Secondary | ICD-10-CM

## 2021-08-10 HISTORY — DX: Other bursal cyst, unspecified site: M71.30

## 2021-09-20 ENCOUNTER — Other Ambulatory Visit: Payer: Self-pay | Admitting: Family Medicine

## 2021-10-01 ENCOUNTER — Ambulatory Visit: Payer: BC Managed Care – PPO | Admitting: Family Medicine

## 2021-10-11 ENCOUNTER — Other Ambulatory Visit: Payer: Self-pay

## 2021-10-11 ENCOUNTER — Ambulatory Visit: Payer: BC Managed Care – PPO | Admitting: Family Medicine

## 2021-10-11 ENCOUNTER — Encounter: Payer: Self-pay | Admitting: Family Medicine

## 2021-10-11 VITALS — BP 135/79 | HR 61 | Temp 98.4°F | Ht 72.0 in | Wt 232.0 lb

## 2021-10-11 DIAGNOSIS — I10 Essential (primary) hypertension: Secondary | ICD-10-CM

## 2021-10-11 DIAGNOSIS — E669 Obesity, unspecified: Secondary | ICD-10-CM | POA: Diagnosis not present

## 2021-10-11 MED ORDER — AMLODIPINE BESYLATE 5 MG PO TABS: 5.0000 mg | ORAL_TABLET | Freq: Every day | ORAL | 1 refills | Status: DC

## 2021-10-11 MED ORDER — LISINOPRIL 40 MG PO TABS: 40.0000 mg | ORAL_TABLET | Freq: Every day | ORAL | 1 refills | Status: DC

## 2021-10-11 NOTE — Progress Notes (Signed)
This visit occurred during the SARS-CoV-2 public health emergency.  Safety protocols were in place, including screening questions prior to the visit, additional usage of staff PPE, and extensive cleaning of exam room while observing appropriate contact time as indicated for disinfecting solutions.    Patient ID: Austin Trevino, male  DOB: 07/24/1966, 55 y.o.   MRN: [id number] Patient Care Team    Relationship Specialty Notifications Start End  Ma Hillock, DO PCP - General Family Medicine  06/29/15   Kathee Delton, MD Consulting Physician Pulmonary Disease  03/25/15   Gatha Mayer, MD Consulting Physician Gastroenterology  10/04/19     Chief Complaint  Patient presents with   Hypertension    [place]; pt is not fasting    Subjective: Austin Trevino is a 55 y.o. male present for [facility]. All past medical history, surgical history, allergies, family history, immunizations, medications and social history were updated in the electronic medical record today. All recent labs, ED visits and hospitalizations within the last year were reviewed.  Hypertension:  Pt reports compliance  with lisinopril 40 mg and amlodipine 5 mg QD. Patient denies chest pain, shortness of breath, dizziness or lower extremity edema.  Diet: low sodium   Depression screen [facility] 2/9 10/11/2021 11/03/2020 10/04/2019 03/28/2018  Decreased Interest 0 0 0 0  Down, Depressed, Hopeless 0 0 0 0  PHQ - 2 Score 0 0 0 0   No flowsheet data found.      No flowsheet data found.  Immunization History  Administered Date(s) Administered   Influenza-Unspecified 08/28/2014, 07/29/2020, 09/27/2021   PFIZER(Purple Top)SARS-COV-2 Vaccination 02/07/2020, 02/28/2020, 11/05/2020   Tdap 07/02/2017   Zoster Recombinat (Shingrix) 10/04/2019, 04/06/2021   Past Medical History:  Diagnosis Date   GERD (gastroesophageal reflux disease)    Hematuria    Hx of colonic polyps - adenomas and ssp 02/18/2015   Hypertension    treated "years ago"  but he did some TLC/lost wt and was able to get off meds.   Obesity, Class I, BMI 30-34.9    OSA (obstructive sleep apnea) 01/2015   Suspected: eval by Dr. Gwenette Greet 02/2015, sleep study recommended.never completed.    No Known Allergies Past Surgical History:  Procedure Laterality Date   COLONOSCOPY     COLONOSCOPY W/ POLYPECTOMY     Family History  Problem Relation Age of Onset   Arthritis Mother    Hypertension Mother    Colon polyps Mother    Hypertension Father    Myelodysplastic syndrome Father    Cancer Father    Neuropathy Sister        amyloidosis   Prostate cancer Paternal Grandfather    Asthma Paternal Grandmother    Colon cancer Neg Hx    Rectal cancer Neg Hx    Stomach cancer Neg Hx    Social History   Social History Narrative   Divorced, 3 sons in middle school/HS [place]).   Educ: BS management from [employer].  Orig from [place].   Occupation: Nurse, learning disability for [employer].   No tob, alcohol intake occasional/social.    Allergies as of 10/11/2021   No Known Allergies      Medication List        Accurate as of October 11, 2021 12:38 PM. If you have any questions, ask your nurse or doctor.          amLODipine 5 MG tablet Commonly known as: NORVASC Take 1 tablet (5 mg total) by mouth daily.  finasteride 5 MG tablet Commonly known as: PROSCAR Take 5 mg by mouth daily.   lisinopril 40 MG tablet Commonly known as: ZESTRIL Take 1 tablet (40 mg total) by mouth daily.   multivitamin tablet Take 1 tablet by mouth daily.   pantoprazole 40 MG tablet Commonly known as: PROTONIX TAKE ONE TABLET BY MOUTH DAILY. will FOLLOWING UP visit FOR further refills   tamsulosin 0.4 MG Caps capsule Commonly known as: FLOMAX Take 0.4 mg by mouth daily.       All past medical history, surgical history, allergies, family history, immunizations andmedications were updated in the EMR today and reviewed under the history and medication portions of  their EMR.     No results found for this or any previous visit (from the past 2160 hour(s)).    ROS: 14 pt review of systems performed and negative (unless mentioned in an HPI)  Objective: BP 135/79   Pulse 61   Temp 98.4 F (36.9 C) (Oral)   Ht 6' (1.829 m)   Wt 232 lb (105.2 kg)   SpO2 97%   BMI 31.46 kg/m  Gen: Afebrile. No acute distress.  HENT: AT. [place].  Eyes:Pupils Equal Round Reactive to light, Extraocular movements intact,  Conjunctiva without redness, discharge or icterus. Neck/lymp/endocrine: Supple,no lymphadenopathy, no thyromegaly CV: RRR no murmur, no edema, +2/4 P posterior tibialis pulses Chest: CTAB, no wheeze or crackles Skin: no rashes, purpura or petechiae.  Neuro: Normal gait. PERLA. EOMi. Alert. Oriented x3 Psych: Normal affect, dress and demeanor. Normal speech. Normal thought content and judgment.  No results found.  Assessment/plan: Austin Trevino is a 55 y.o. male present for CPE Gastroesophageal reflux disease without esophagitis/long term PPI Stable Continue protonix 40 mg qd Next labs monitor b12, vit d and mag for long term PPI use. Pt takes a MV.  Essential hypertension/ Obesity (BMI 30-39.9) Stable Continue amlodipine to 5 mg qd Continue lisinopril 40 mg qd.  F/u 5.5 mos. (Cpe)   Return in about 6 months (around 04/10/2022) for CPE (30 min).  No orders of the defined types were placed in this encounter.   Meds ordered this encounter  Medications   lisinopril (ZESTRIL) 40 MG tablet    Sig: Take 1 tablet (40 mg total) by mouth daily.    Dispense:  90 tablet    Refill:  1   amLODipine (NORVASC) 5 MG tablet    Sig: Take 1 tablet (5 mg total) by mouth daily.    Dispense:  90 tablet    Refill:  1    Referral Orders  No referral(s) requested today     Note is dictated utilizing voice recognition software. Although note has been proof read prior to signing, occasional typographical errors still can be missed. If any questions  arise, please do not hesitate to call for verification.  Electronically signed by: Howard Pouch, DO [place]

## 2021-10-11 NOTE — Patient Instructions (Signed)
Great to see you today.  I have refilled the medication(s) we provide.   If labs were collected, we will inform you of lab results once received either by echart message or telephone call.   - echart message- for normal results that have been seen by the patient already.   - telephone call: abnormal results or if patient has not viewed results in their echart.  

## 2022-01-10 ENCOUNTER — Other Ambulatory Visit: Payer: Self-pay | Admitting: Family Medicine

## 2022-01-31 DIAGNOSIS — R351 Nocturia: Secondary | ICD-10-CM | POA: Diagnosis not present

## 2022-01-31 DIAGNOSIS — N401 Enlarged prostate with lower urinary tract symptoms: Secondary | ICD-10-CM | POA: Diagnosis not present

## 2022-01-31 LAB — PSA: PSA: 0.61

## 2022-02-07 ENCOUNTER — Other Ambulatory Visit: Payer: Self-pay | Admitting: Family Medicine

## 2022-04-11 ENCOUNTER — Ambulatory Visit (INDEPENDENT_AMBULATORY_CARE_PROVIDER_SITE_OTHER): Payer: BC Managed Care – PPO | Admitting: Family Medicine

## 2022-04-11 ENCOUNTER — Encounter: Payer: Self-pay | Admitting: Family Medicine

## 2022-04-11 VITALS — BP 137/83 | HR 66 | Temp 98.2°F | Ht 71.85 in | Wt 235.8 lb

## 2022-04-11 DIAGNOSIS — K219 Gastro-esophageal reflux disease without esophagitis: Secondary | ICD-10-CM

## 2022-04-11 DIAGNOSIS — I10 Essential (primary) hypertension: Secondary | ICD-10-CM | POA: Diagnosis not present

## 2022-04-11 DIAGNOSIS — G479 Sleep disorder, unspecified: Secondary | ICD-10-CM

## 2022-04-11 DIAGNOSIS — Z0000 Encounter for general adult medical examination without abnormal findings: Secondary | ICD-10-CM

## 2022-04-11 DIAGNOSIS — Z86010 Personal history of colonic polyps: Secondary | ICD-10-CM

## 2022-04-11 DIAGNOSIS — Z5181 Encounter for therapeutic drug level monitoring: Secondary | ICD-10-CM

## 2022-04-11 DIAGNOSIS — E669 Obesity, unspecified: Secondary | ICD-10-CM

## 2022-04-11 DIAGNOSIS — Z79899 Other long term (current) drug therapy: Secondary | ICD-10-CM | POA: Diagnosis not present

## 2022-04-11 DIAGNOSIS — R0683 Snoring: Secondary | ICD-10-CM

## 2022-04-11 LAB — CBC
HCT: 44.9 % (ref 39.0–52.0)
Hemoglobin: 15.2 g/dL (ref 13.0–17.0)
MCHC: 34 g/dL (ref 30.0–36.0)
MCV: 92.9 fl (ref 78.0–100.0)
Platelets: 209 10*3/uL (ref 150.0–400.0)
RBC: 4.83 Mil/uL (ref 4.22–5.81)
RDW: 13.6 % (ref 11.5–15.5)
WBC: 7.3 10*3/uL (ref 4.0–10.5)

## 2022-04-11 LAB — VITAMIN D 25 HYDROXY (VIT D DEFICIENCY, FRACTURES): VITD: 23.26 ng/mL — ABNORMAL LOW (ref 30.00–100.00)

## 2022-04-11 LAB — TSH: TSH: 1.92 u[IU]/mL (ref 0.35–5.50)

## 2022-04-11 LAB — HEMOGLOBIN A1C: Hgb A1c MFr Bld: 5.3 % (ref 4.6–6.5)

## 2022-04-11 LAB — VITAMIN B12: Vitamin B-12: 716 pg/mL (ref 211–911)

## 2022-04-11 MED ORDER — LISINOPRIL 40 MG PO TABS: 40.0000 mg | ORAL_TABLET | Freq: Every day | ORAL | 1 refills | Status: DC

## 2022-04-11 MED ORDER — AMLODIPINE BESYLATE 5 MG PO TABS: 5.0000 mg | ORAL_TABLET | Freq: Every day | ORAL | 1 refills | Status: DC

## 2022-04-11 NOTE — Progress Notes (Signed)
? ? ? ? ? ?Ander Gaster , 27-Aug-1966, 56 y.o., male ?MRN: [id number] ?Patient Care Team  ?  Relationship Specialty Notifications Start End  ?Ma Hillock, DO PCP - General Family Medicine  06/29/15   ?Kathee Delton, MD Consulting Physician Pulmonary Disease  03/25/15   ?Gatha Mayer, MD Consulting Physician Gastroenterology  10/04/19   ? ? ?Chief Complaint  ?Patient presents with  ?? Annual Exam  ?  Pt is fasting  ? ?  ?Subjective: Austin Trevino is a 56 y.o. male present for cpe/cmc combo appt. All past medical history, surgical history, allergies, family history, immunizations and social history were updated in  into the electronic medical record. ? ?Health maintenance:  ?Colonoscopy: completed 11/2019, by Dr. Carlean Purl, . follow up 5 yrs ?Immunizations: tdap UTD 2018, Influenza UTD 2022 (encouraged yearly), Shingrix completed,  covid counseled/completed ?Infectious disease screening: HIV declined and Hep C completed  ?PSA: pt follows with urology- last PSA 01/2022- 0.61 ?Assistive device: none ?Oxygen DQQ:IWLN ?Patient has a Dental home. ?Hospitalizations/ED visits: reviewed ? ?Hypertension:  ?Pt reports compliance  with lisinopril 40 mg and amlodipine 5 mg QD. Patient denies chest pain, shortness of breath, dizziness or lower extremity edema.  ?Diet: low sodium ? ?Gerd- stable on Protonix- has required long term treatment with PPI ? ? ?  04/11/2022  ?  8:11 AM 10/11/2021  ? 11:03 AM 11/03/2020  ? 10:41 AM 10/04/2019  ?  8:06 AM 03/28/2018  ?  1:59 PM  ?Depression screen PHQ 2/9  ?Decreased Interest 0 0 0 0 0  ?Down, Depressed, Hopeless 0 0 0 0 0  ?PHQ - 2 Score 0 0 0 0 0  ?Altered sleeping 3      ?Tired, decreased energy 1      ?Change in appetite 1      ?Feeling bad or failure about yourself  0      ?Trouble concentrating 0      ?Moving slowly or fidgety/restless 0      ?Suicidal thoughts 0      ?PHQ-9 Score 5      ? ? ?No Known Allergies ?Social History  ? ?Social History Narrative  ? Divorced, 3 sons in middle  school/HS [employer]).  ? Educ: BS management from [place].  Orig from [place].  ? Occupation: Nurse, learning disability for [employer].  ? No tob, alcohol intake occasional/social.  ? ?Past Medical History:  ?Diagnosis Date  ?? GERD (gastroesophageal reflux disease)   ?? Hematuria   ?? Hx of colonic polyps - adenomas and ssp 02/18/2015  ?? Hypertension   ? treated "years ago" but he did some TLC/lost wt and was able to get off meds.  ?? Obesity, Class I, BMI 30-34.9   ?? OSA (obstructive sleep apnea) 01/2015  ? Suspected: eval by Dr. Gwenette Greet 02/2015, sleep study recommended.never completed.   ? ?Past Surgical History:  ?Procedure Laterality Date  ?? COLONOSCOPY    ?? COLONOSCOPY W/ POLYPECTOMY    ? ?Family History  ?Problem Relation Age of Onset  ?? Arthritis Mother   ?? Hypertension Mother   ?? Colon polyps Mother   ?? Hypertension Father   ?? Myelodysplastic syndrome Father   ?? Cancer Father   ?? Neuropathy Sister   ?     amyloidosis  ?? Prostate cancer Paternal Grandfather   ?? Asthma Paternal Grandmother   ?? Colon cancer Neg Hx   ?? Rectal cancer Neg Hx   ?? Stomach cancer Neg Hx   ? ?  Allergies as of 04/11/2022   ?No Known Allergies ?  ? ?  ?Medication List  ?  ? ?  ? Accurate as of Apr 11, 2022  9:19 AM. If you have any questions, ask your nurse or doctor.  ?  ?  ? ?  ? ?amLODipine 5 MG tablet ?Commonly known as: NORVASC ?Take 1 tablet (5 mg total) by mouth daily. ?  ?finasteride 5 MG tablet ?Commonly known as: PROSCAR ?Take 5 mg by mouth daily. ?  ?lisinopril 40 MG tablet ?Commonly known as: ZESTRIL ?Take 1 tablet (40 mg total) by mouth daily. ?  ?multivitamin tablet ?Take 1 tablet by mouth daily. ?  ?pantoprazole 40 MG tablet ?Commonly known as: PROTONIX ?TAKE ONE TABLET BY MOUTH DAILY. will FOLLOWING UP visit FOR further refills ?  ?tamsulosin 0.4 MG Caps capsule ?Commonly known as: FLOMAX ?Take 0.4 mg by mouth daily. ?  ? ?  ? ? ?All past medical history, surgical history, allergies, family history,  immunizations andmedications were updated in the EMR today and reviewed under the history and medication portions of their EMR.    ? ?ROS ?Negative, with the exception of above mentioned in HPI ? ? ?Objective:  ?BP 137/83   Pulse 66   Temp 98.2 ?F (36.8 ?C)   Ht 5' 11.85" (1.825 m)   Wt 235 lb 12.8 oz (107 kg)   SpO2 96%   BMI 32.11 kg/m?  ?Body mass index is 32.11 kg/m?Marland Kitchen ?Physical Exam ?Constitutional:   ?   General: He is not in acute distress. ?   Appearance: Normal appearance. He is obese. He is not ill-appearing, toxic-appearing or diaphoretic.  ?HENT:  ?   Head: Normocephalic and atraumatic.  ?   Right Ear: Tympanic membrane, ear canal and external ear normal. There is no impacted cerumen.  ?   Left Ear: Tympanic membrane, ear canal and external ear normal. There is no impacted cerumen.  ?   Nose: Nose normal. No congestion or rhinorrhea.  ?   Mouth/Throat:  ?   Mouth: Mucous membranes are moist.  ?   Pharynx: Oropharynx is clear. No oropharyngeal exudate or posterior oropharyngeal erythema.  ?Eyes:  ?   General: No scleral icterus.    ?   Right eye: No discharge.     ?   Left eye: No discharge.  ?   Extraocular Movements: Extraocular movements intact.  ?   Pupils: Pupils are equal, round, and reactive to light.  ?Cardiovascular:  ?   Rate and Rhythm: Normal rate and regular rhythm.  ?   Pulses: Normal pulses.  ?   Heart sounds: Normal heart sounds. No murmur heard. ?  No friction rub. No gallop.  ?Pulmonary:  ?   Effort: Pulmonary effort is normal. No respiratory distress.  ?   Breath sounds: Normal breath sounds. No stridor. No wheezing, rhonchi or rales.  ?Chest:  ?   Chest wall: No tenderness.  ?Abdominal:  ?   General: Abdomen is flat. Bowel sounds are normal. There is no distension.  ?   Palpations: Abdomen is soft. There is no mass.  ?   Tenderness: There is no abdominal tenderness. There is no right CVA tenderness, left CVA tenderness, guarding or rebound.  ?   Hernia: No hernia is present.   ?Musculoskeletal:     ?   General: No swelling or tenderness. Normal range of motion.  ?   Cervical back: Normal range of motion and neck supple.  ?  Right lower leg: No edema.  ?   Left lower leg: No edema.  ?Lymphadenopathy:  ?   Cervical: No cervical adenopathy.  ?Skin: ?   General: Skin is warm and dry.  ?   Coloration: Skin is not jaundiced.  ?   Findings: No bruising, lesion or rash.  ?Neurological:  ?   General: No focal deficit present.  ?   Mental Status: He is alert and oriented to person, place, and time. Mental status is at baseline.  ?   Cranial Nerves: No cranial nerve deficit.  ?   Sensory: No sensory deficit.  ?   Motor: No weakness.  ?   Coordination: Coordination normal.  ?   Gait: Gait normal.  ?   Deep Tendon Reflexes: Reflexes normal.  ?Psychiatric:     ?   Mood and Affect: Mood normal.     ?   Behavior: Behavior normal.     ?   Thought Content: Thought content normal.     ?   Judgment: Judgment normal.  ? ? ?No results found. ?No results found. ?No results found for this or any previous visit (from the past 24 hour(s)). ? ?Assessment/Plan: ?Francis Doenges is a 56 y.o. male present for OV for cpe/cmc ?Gastroesophageal reflux disease without esophagitis/long term PPI ?Stable ?continue protonix 40 mg qd ?Next labs monitor b12, vit d and mag for long term PPI use. Pt takes a MV. ?  ?Essential hypertension/ Obesity (BMI 30-39.9) ?Stable ?Continue amlodipine to 5 mg qd ?continue lisinopril 40 mg qd.  ?F/u 5.5 mos.  ?Hx of colonic polyps - adenomas and ssp ?2026 due ?Obesity (BMI 30-39.9) ?- Hemoglobin A1c ?Gastroesophageal reflux disease without esophagitis ?- Vitamin D (25 hydroxy) ?- B12 ?- Magnesium ?Encounter for monitoring long-term proton pump inhibitor therapy ?- Vitamin D (25 hydroxy) ?- B12 ?- Magnesium ?OSA: ?Probable OSA: ?certainly has sleep d/o.  ?- referral placed to sleep specialist.  ?Routine general medical examination at a health care facility ?Colonoscopy: completed 11/2019, by  Dr. Carlean Purl, . follow up 5 yrs ?Immunizations: tdap UTD 2018, Influenza UTD 2022 (encouraged yearly), Shingrix completed,  covid counseled/completed ?Infectious disease screening: HIV declined and Hep C completed  ?PSA: pt

## 2022-04-11 NOTE — Patient Instructions (Signed)
Return in about 24 weeks (around 09/26/2022) for Routine chronic condition follow-up. ? ? ? ? ? ? ? ?Great to see you today.  ?I have refilled the medication(s) we provide.  ? ?If labs were collected, we will inform you of lab results once received either by echart message or telephone call.  ? - echart message- for normal results that have been seen by the patient already.  ? - telephone call: abnormal results or if patient has not viewed results in their echart. ?Health Maintenance, Male ?Adopting a healthy lifestyle and getting preventive care are important in promoting health and wellness. Ask your health care provider about: ?The right schedule for you to have regular tests and exams. ?Things you can do on your own to prevent diseases and keep yourself healthy. ?What should I know about diet, weight, and exercise? ?Eat a healthy diet ? ?Eat a diet that includes plenty of vegetables, fruits, low-fat dairy products, and lean protein. ?Do not eat a lot of foods that are high in solid fats, added sugars, or sodium. ?Maintain a healthy weight ?Body mass index (BMI) is a measurement that can be used to identify possible weight problems. It estimates body fat based on height and weight. Your health care provider can help determine your BMI and help you achieve or maintain a healthy weight. ?Get regular exercise ?Get regular exercise. This is one of the most important things you can do for your health. Most adults should: ?Exercise for at least 150 minutes each week. The exercise should increase your heart rate and make you sweat (moderate-intensity exercise). ?Do strengthening exercises at least twice a week. This is in addition to the moderate-intensity exercise. ?Spend less time sitting. Even light physical activity can be beneficial. ?Watch cholesterol and blood lipids ?Have your blood tested for lipids and cholesterol at 58 years of age, then have this test every 5 years. ?You may need to have your cholesterol  levels checked more often if: ?Your lipid or cholesterol levels are high. ?You are older than 58 years of age. ?You are at high risk for heart disease. ?What should I know about cancer screening? ?Many types of cancers can be detected early and may often be prevented. Depending on your health history and family history, you may need to have cancer screening at various ages. This may include screening for: ?Colorectal cancer. ?Prostate cancer. ?Skin cancer. ?Lung cancer. ?What should I know about heart disease, diabetes, and high blood pressure? ?Blood pressure and heart disease ?High blood pressure causes heart disease and increases the risk of stroke. This is more likely to develop in people who have high blood pressure readings or are overweight. ?Talk with your health care provider about your target blood pressure readings. ?Have your blood pressure checked: ?Every 3-5 years if you are 77-21 years of age. ?Every year if you are 1 years old or older. ?If you are between the ages of 66 and 109 and are a current or former smoker, ask your health care provider if you should have a one-time screening for abdominal aortic aneurysm (AAA). ?Diabetes ?Have regular diabetes screenings. This checks your fasting blood sugar level. Have the screening done: ?Once every three years after age 37 if you are at a normal weight and have a low risk for diabetes. ?More often and at a younger age if you are overweight or have a high risk for diabetes. ?What should I know about preventing infection? ?Hepatitis B ?If you have a higher risk for  hepatitis B, you should be screened for this virus. Talk with your health care provider to find out if you are at risk for hepatitis B infection. ?Hepatitis C ?Blood testing is recommended for: ?Everyone born from [birth year] through 1967. ?Anyone with known risk factors for hepatitis C. ?Sexually transmitted infections (STIs) ?You should be screened each year for STIs, including gonorrhea and chlamydia,  if: ?You are sexually active and are younger than 58 years of age. ?You are older than 58 years of age and your health care provider tells you that you are at risk for this type of infection. ?Your sexual activity has changed since you were last screened, and you are at increased risk for chlamydia or gonorrhea. Ask your health care provider if you are at risk. ?Ask your health care provider about whether you are at high risk for HIV. Your health care provider may recommend a prescription medicine to help prevent HIV infection. If you choose to take medicine to prevent HIV, you should first get tested for HIV. You should then be tested every 3 months for as long as you are taking the medicine. ?Follow these instructions at home: ?Alcohol use ?Do not drink alcohol if your health care provider tells you not to drink. ?If you drink alcohol: ?Limit how much you have to 0-2 drinks a day. ?Know how much alcohol is in your drink. In the [place] one drink equals one 12 oz bottle of beer (355 mL), one 5 oz glass of wine (148 mL), or one 1? oz glass of hard liquor (44 mL). ?Lifestyle ?Do not use any products that contain nicotine or tobacco. These products include cigarettes, chewing tobacco, and vaping devices, such as e-cigarettes. If you need help quitting, ask your health care provider. ?Do not use street drugs. ?Do not share needles. ?Ask your health care provider for help if you need support or information about quitting drugs. ?General instructions ?Schedule regular health, dental, and eye exams. ?Stay current with your vaccines. ?Tell your health care provider if: ?You often feel depressed. ?You have ever been abused or do not feel safe at home. ?Summary ?Adopting a healthy lifestyle and getting preventive care are important in promoting health and wellness. ?Follow your health care provider's instructions about healthy diet, exercising, and getting tested or screened for diseases. ?Follow your health care provider's  instructions on monitoring your cholesterol and blood pressure. ?This information is not intended to replace advice given to you by your health care provider. Make sure you discuss any questions you have with your health care provider. ?Document Revised: 04/05/2021 Document Reviewed: 04/05/2021 ?Elsevier Patient Education ? [place]. ? ?

## 2022-04-12 LAB — COMPREHENSIVE METABOLIC PANEL
ALT: 32 U/L (ref 0–53)
AST: 20 U/L (ref 0–37)
Albumin: 4.2 g/dL (ref 3.5–5.2)
Alkaline Phosphatase: 63 U/L (ref 39–117)
BUN: 11 mg/dL (ref 6–23)
CO2: 27 mEq/L (ref 19–32)
Calcium: 9.1 mg/dL (ref 8.4–10.5)
Chloride: 105 mEq/L (ref 96–112)
Creatinine, Ser: 0.99 mg/dL (ref 0.40–1.50)
GFR: 84.4 mL/min (ref >60.00)
Glucose, Bld: 91 mg/dL (ref 70–99)
Potassium: 3.7 mEq/L (ref 3.5–5.1)
Sodium: 141 mEq/L (ref 135–145)
Total Bilirubin: 0.8 mg/dL (ref 0.2–1.2)
Total Protein: 6.9 g/dL (ref 6.0–8.3)

## 2022-04-12 LAB — LIPID PANEL
Cholesterol: 193 mg/dL (ref 0–200)
HDL: 39.8 mg/dL (ref >39.00)
LDL Cholesterol: 119 mg/dL — ABNORMAL HIGH (ref 0–99)
NonHDL: 152.71
Total CHOL/HDL Ratio: 5
Triglycerides: 168 mg/dL — ABNORMAL HIGH (ref 0.0–149.0)
VLDL: 33.6 mg/dL (ref 0.0–40.0)

## 2022-04-12 LAB — MAGNESIUM: Magnesium: 2 mg/dL (ref 1.5–2.5)

## 2022-04-18 ENCOUNTER — Telehealth: Payer: Self-pay | Admitting: Family Medicine

## 2022-04-18 DIAGNOSIS — I10 Essential (primary) hypertension: Secondary | ICD-10-CM

## 2022-04-18 DIAGNOSIS — G4720 Circadian rhythm sleep disorder, unspecified type: Secondary | ICD-10-CM

## 2022-04-18 DIAGNOSIS — E669 Obesity, unspecified: Secondary | ICD-10-CM

## 2022-04-18 DIAGNOSIS — R0683 Snoring: Secondary | ICD-10-CM

## 2022-04-18 NOTE — Telephone Encounter (Signed)
New referral placed for OSA workup

## 2022-05-04 ENCOUNTER — Other Ambulatory Visit: Payer: Self-pay | Admitting: Family Medicine

## 2022-06-14 ENCOUNTER — Institutional Professional Consult (permissible substitution): Payer: BC Managed Care – PPO | Admitting: Neurology

## 2022-07-13 ENCOUNTER — Ambulatory Visit: Payer: BC Managed Care – PPO | Admitting: Neurology

## 2022-07-13 ENCOUNTER — Encounter: Payer: Self-pay | Admitting: Neurology

## 2022-07-13 VITALS — BP 140/86 | HR 58 | Ht 72.0 in | Wt 241.0 lb

## 2022-07-13 DIAGNOSIS — R0683 Snoring: Secondary | ICD-10-CM

## 2022-07-13 DIAGNOSIS — R0681 Apnea, not elsewhere classified: Secondary | ICD-10-CM | POA: Diagnosis not present

## 2022-07-13 DIAGNOSIS — K219 Gastro-esophageal reflux disease without esophagitis: Secondary | ICD-10-CM

## 2022-07-13 DIAGNOSIS — G4709 Other insomnia: Secondary | ICD-10-CM

## 2022-07-13 DIAGNOSIS — J310 Chronic rhinitis: Secondary | ICD-10-CM | POA: Diagnosis not present

## 2022-07-13 MED ORDER — TRIAMCINOLONE ACETONIDE 55 MCG/ACT NA AERO: 2.0000 | INHALATION_SPRAY | Freq: Every day | NASAL | 12 refills | Status: DC

## 2022-07-13 NOTE — Patient Instructions (Signed)
Quality Sleep Information, Adult Quality sleep is important for your mental and physical health. It also improves your quality of life. Quality sleep means you: Are asleep for most of the time you are in bed. Fall asleep within 30 minutes. Wake up no more than once a night. Are awake for no longer than 20 minutes if you do wake up during the night. Most adults need 7-8 hours of quality sleep each night. How can poor sleep affect me? If you do not get enough quality sleep, you may have: Mood swings. Daytime sleepiness. Decreased alertness, reaction time, and concentration. Sleep disorders, such as insomnia and sleep apnea. Difficulty with: Solving problems. Coping with stress. Paying attention. These issues may affect your performance and productivity at work, school, and home. Lack of sleep may also put you at higher risk for accidents, suicide, and risky behaviors. If you do not get quality sleep, you may also be at higher risk for several health problems, including: Infections. Type 2 diabetes. Heart disease. High blood pressure. Obesity. Worsening of long-term conditions, like arthritis, kidney disease, depression, Parkinson's disease, and epilepsy. What actions can I take to get more quality sleep? Sleep schedule and routine Stick to a sleep schedule. Go to sleep and wake up at about the same time each day. Do not try to sleep less on weekdays and make up for lost sleep on weekends. This does not work. Limit naps during the day to 30 minutes or less. Do not take naps in the late afternoon. Make time to relax before bed. Reading, listening to music, or taking a hot bath promotes quality sleep. Make your bedroom a place that promotes quality sleep. Keep your bedroom dark, quiet, and at a comfortable room temperature. Make sure your bed is comfortable. Avoid using electronic devices that give off bright blue light for 30 minutes before bedtime. Your brain perceives bright blue light  as sunlight. This includes television, phones, and computers. If you are lying awake in bed for longer than 20 minutes, get up and do a relaxing activity until you feel sleepy. Lifestyle     Try to get at least 30 minutes of exercise on most days. Do not exercise 2-3 hours before going to bed. Do not use any products that contain nicotine or tobacco. These products include cigarettes, chewing tobacco, and vaping devices, such as e-cigarettes. If you need help quitting, ask your health care provider. Do not drink caffeinated beverages for at least 8 hours before going to bed. Coffee, tea, and some sodas contain caffeine. Do not drink alcohol or eat large meals close to bedtime. Try to get at least 30 minutes of sunlight every day. Morning sunlight is best. Medical concerns Work with your health care provider to treat medical conditions that may affect sleeping, such as: Nasal obstruction. Snoring. Sleep apnea and other sleep disorders. Talk to your health care provider if you think any of your prescription medicines may cause you to have difficulty falling or staying asleep. If you have sleep problems, talk with a sleep consultant. If you think you have a sleep disorder, talk with your health care provider about getting evaluated by a specialist. Where to find more information Sleep Foundation: sleepfoundation.org American Academy of Sleep Medicine: aasm.org Centers for Disease Control and Prevention (CDC): StoreMirror.com.cy Contact a health care provider if: You have trouble getting to sleep or staying asleep. You often wake up very early in the morning and cannot get back to sleep. You have daytime sleepiness. You  have daytime sleep attacks of suddenly falling asleep and sudden muscle weakness (narcolepsy). You have a tingling sensation in your legs with a strong urge to move your legs (restless legs syndrome). You stop breathing briefly during sleep (sleep apnea). You think you have a sleep  disorder or are taking a medicine that is affecting your quality of sleep. Summary Most adults need 7-8 hours of quality sleep each night. Getting enough quality sleep is important for your mental and physical health. Make your bedroom a place that promotes quality sleep, and avoid things that may cause you to have poor sleep, such as alcohol, caffeine, smoking, or large meals. Talk to your health care provider if you have trouble falling asleep or staying asleep. This information is not intended to replace advice given to you by your health care provider. Make sure you discuss any questions you have with your health care provider. Document Revised: 03/09/2022 Document Reviewed: 03/09/2022 Elsevier Patient Education  2023 Elsevier Inc.  

## 2022-07-13 NOTE — Progress Notes (Signed)
SLEEP MEDICINE CLINIC    Provider:  Larey Seat, MD  Primary Care Physician:  Ma Hillock, DO [address] [place] [address]     Referring Provider: Ma Hillock, Do [address],  [place] [address]          Chief Complaint according to patient   Patient presents with:     New Patient (Initial Visit)           HISTORY OF PRESENT ILLNESS:   07-13-2022: Austin Trevino is a 56 y.o. Caucasian male patient seen here upon referral on 07/13/2022 from Dr Raoul Pitch, DO,  for a Sleep medicine consultation..  Chief concern according to patient : " I wake up many times now and snore, My nose is congested, but I have no trouble falling asleep. I rarely drink alcohol. "   Gertrude Tarbet  has a past medical history of GERD (gastroesophageal reflux disease), Hematuria, Hx of colonic polyps - adenomas and ssp (02/18/2015), Hypertension, Obesity, Class I, BMI 30-34.9, and OSA (obstructive sleep apnea) (01/2015).     Sleep relevant medical history: Nocturia : yes, BPH-and I had hematuria last June-  Sleep walking-no, no Tonsillectomy, wisdom teeth removed, wore braces    Family medical /sleep history: father was on CPAP with OSA, he was obese.   Social history: Patient is working from home for the last 8 years- used to travel for work- and lives in a household alone, with a Programmer, systems.  Family status is single  - grown children.  The patient currently works day time hours. Tobacco use: none .  ETOH use - 2/ week or less,  Caffeine intake in form of Coffee( /) Soda( 2/ day) Tea ( /) or energy drinks. Regular exercise in form of walking..     Sleep habits are as follows: The patient's dinner time is between 3-4 PM. The patient goes to bed at 10 PM and continues to sleep for 1-2 hour intervals- in a bedroom that is cool , dark, quiet, fan in the background- ,he wakes for 2-3 bathroom breaks, has GERD related arousals, too. the first time at 1 AM.   The preferred sleep  position is laterally, with the support of 2-3 pillows.  Dreams are reportedly frequent/vivid.  6.30 AM is the usual rise time. The patient wakes up spontaneously.  He reports not feeling refreshed or restored in AM, with symptoms such as dry mouth, rare  morning headaches, and residual fatigue.  Naps are taken frequently, lasting from 20 to 60 minutes and are more refreshing than nocturnal sleep.    Review of Systems: Out of a complete 14 system review, the patient complains of only the following symptoms, and all other reviewed systems are negative.:  Fatigue, sleepiness , snoring, fragmented sleep, Insomnia , GERD.    How likely are you to doze in the following situations: 0 = not likely, 1 = slight chance, 2 = moderate chance, 3 = high chance   Sitting and Reading? Watching Television? Sitting inactive in a public place (theater or meeting)? As a passenger in a car for an hour without a break? Lying down in the afternoon when circumstances permit? Sitting and talking to someone? Sitting quietly after lunch without alcohol? In a car, while stopped for a few minutes in traffic?   Total = 11/ 24 points   FSS endorsed at  29 / 63 points.   Social History   Socioeconomic History  Marital status: Single/ Divorced     Spouse name: Not on file   Number of children: 3 adult children, in the area.    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   Educ: BS management from [place].  Orig from [place].   Occupation: Nurse, learning disability for [employer].   No tob, alcohol intake occasional/social.   Social Determinants of Health   Financial Resource Strain: Not on file  Food Insecurity: Not on file  Transportation Needs: Not on file  Physical Activity: Not on file  Stress: Not on file  Social Connections: Not on file    Family History  Problem Relation Age of Onset   Arthritis Mother    Hypertension Mother    Colon polyps Mother    Hypertension Father    Myelodysplastic syndrome Father    Cancer Father    Neuropathy Sister        amyloidosis   Prostate cancer Paternal Grandfather    Asthma Paternal Grandmother    Colon cancer Neg Hx    Rectal cancer Neg Hx    Stomach cancer Neg Hx     Past Medical History:  Diagnosis Date   GERD (gastroesophageal reflux disease)    Hematuria    Hx of colonic polyps - adenomas and ssp 02/18/2015   Hypertension    treated "years ago" but he did some TLC/lost wt and was able to get off meds.   Obesity, Class I, BMI 30-34.9    OSA (obstructive sleep apnea) 01/2015   Suspected: eval by Dr. Gwenette Greet 02/2015, sleep study recommended.never completed.     Past Surgical History:  Procedure Laterality Date   COLONOSCOPY     COLONOSCOPY W/ POLYPECTOMY       Current Outpatient Medications on File Prior to Visit  Medication Sig Dispense Refill   amLODipine (NORVASC) 5 MG tablet Take 1 tablet (5 mg total) by mouth daily. 90 tablet 1   finasteride (PROSCAR) 5 MG tablet Take 5 mg by mouth daily.     lisinopril (ZESTRIL) 40 MG tablet Take 1 tablet (40 mg total) by mouth daily. 90 tablet 1   Multiple Vitamin (MULTIVITAMIN) tablet Take 1 tablet by mouth daily.     tamsulosin (FLOMAX) 0.4 MG CAPS capsule Take 0.4 mg by mouth daily.     pantoprazole (PROTONIX) 40 MG tablet TAKE ONE TABLET BY MOUTH DAILY. (Patient not taking: Reported on 07/13/2022) 90 tablet 1   Current Facility-Administered Medications on File Prior to Visit  Medication Dose  Route Frequency Provider Last Rate Last Admin   0.9 %  sodium chloride infusion  500 mL Intravenous Once Gatha Mayer, MD        No Known Allergies  Physical exam:  Today's Vitals   07/13/22 0851  BP: (!) 140/86  Pulse: (!) 58  Weight: 241 lb (109.3 kg)  Height: 6' (1.829 m)   Body  mass index is 32.69 kg/m.   Wt Readings from Last 3 Encounters:  07/13/22 241 lb (109.3 kg)  04/11/22 235 lb 12.8 oz (107 kg)  10/11/21 232 lb (105.2 kg)     Ht Readings from Last 3 Encounters:  07/13/22 6' (1.829 m)  04/11/22 5' 11.85" (1.825 m)  10/11/21 6' (1.829 m)      General: The patient is awake, alert and appears not in acute distress. The patient is well groomed. Head: Normocephalic, atraumatic. Neck is supple.  Mallampati: ,3  neck circumference:18. 75 inches . Nasal airflow restricted -  Retrognathia is not  seen.  Dental status: biological Cardiovascular:  Regular rate and cardiac rhythm by pulse,  without distended neck veins. Respiratory: Lungs are clear to auscultation.  Skin:  Without evidence of ankle edema, or rash. Trunk: The patient's posture is erect.   Neurologic exam : The patient is awake and alert, oriented to place and time.   Memory subjective described as intact.  Attention span & concentration ability appears normal.  Speech is fluent,  without  dysarthria, nasal dysphonia or aphasia.  Mood and affect are appropriate.   Cranial nerves: no loss of smell or taste reported  Pupils are equal and briskly reactive to light. Funduscopic exam deferred. .  Extraocular movements in vertical and horizontal planes were intact and without nystagmus. No Diplopia. Visual fields by finger perimetry are intact. Hearing was intact to soft voice and finger rubbing.    Facial sensation intact to fine touch.  Facial motor strength is symmetric and tongue and uvula move midline.  Neck ROM : rotation, tilt and flexion extension were normal for age and shoulder shrug was  symmetrical.    Motor exam:  Symmetric bulk, tone and ROM.   Normal tone without cog- wheeling, symmetric grip strength .   Sensory:  Fine touch, vibration were normal. Right hand carpal tunnel.  Proprioception tested in the upper extremities was normal.   Coordination: Rapid alternating movements in the fingers/hands were of normal speed.  The Finger-to-nose maneuver was intact without evidence of ataxia, dysmetria or tremor.   Gait and station: Patient could rise unassisted from a seated position, walked without assistive device.  Stance is of normal width/ base .  Toe and heel walk were deferred.  Deep tendon reflexes: in the upper and lower extremities are symmetric and intact.  Babinski response was deferred.       After spending a total time of  35  minutes face to face and additional time for physical and neurologic examination, review of laboratory studies,  personal review of imaging studies, reports and results of other testing and review of referral information / records as far as provided in visit, I have established the following assessments:      1) snoring for years, high risk of OSA based on neck size, BMI and upper airway. Nasal congestion.  2) Nocturia now 1-2 times on Flomax  3) GERD causes arousals, he d/c PPI .  4) rhinitis.  5) BMI    My Plan is to proceed with:  1) Sleep studies ordered HST and PSG ordered. SPLIT would be nice if permitted.  2) we will screen for sleep apnea I hope that she will have been able to finish his evaluation within the next 3 to 4 weeks.  If the patient should test positive for sleep apnea in the split-night study I will set the AHI or apnea hypopnea index for a split-night at 20 an hour  with a 3% desaturation.   I would like to thank Ma Hillock, DO and Ma Hillock, Do [address],  [place] [address] for allowing me to meet with and to take care of this pleasant patient.   I plan to follow up either personally or  through our NP within 3 months post sleep test.   CC: I will share my notes with PCP.  Electronically signed by: Larey Seat, MD 07/13/2022 9:22 AM  [facility] Board certified by The [employer] of Sleep Medicine and Diplomate of the [employer] of Sleep Medicine. Board certified In Neurology through the [place], Fellow of the [employer] of Neurology. Medical Director of [facility].

## 2022-07-25 DIAGNOSIS — D3132 Benign neoplasm of left choroid: Secondary | ICD-10-CM | POA: Diagnosis not present

## 2022-07-25 DIAGNOSIS — H5213 Myopia, bilateral: Secondary | ICD-10-CM | POA: Diagnosis not present

## 2022-07-25 DIAGNOSIS — D3131 Benign neoplasm of right choroid: Secondary | ICD-10-CM | POA: Diagnosis not present

## 2022-08-08 DIAGNOSIS — R351 Nocturia: Secondary | ICD-10-CM | POA: Diagnosis not present

## 2022-08-08 DIAGNOSIS — N401 Enlarged prostate with lower urinary tract symptoms: Secondary | ICD-10-CM | POA: Diagnosis not present

## 2022-08-11 DIAGNOSIS — R310 Gross hematuria: Secondary | ICD-10-CM | POA: Diagnosis not present

## 2022-08-11 DIAGNOSIS — R351 Nocturia: Secondary | ICD-10-CM | POA: Diagnosis not present

## 2022-08-11 DIAGNOSIS — N401 Enlarged prostate with lower urinary tract symptoms: Secondary | ICD-10-CM | POA: Diagnosis not present

## 2022-08-16 ENCOUNTER — Telehealth: Payer: Self-pay | Admitting: Neurology

## 2022-08-16 NOTE — Telephone Encounter (Signed)
Split- BCBS no auth req spoke to [facility] ref # [id number]- pt chose.   Patient is scheduled at [facility] for 09/12/22 at 9 pm.  Mailed packet to the patient.

## 2022-09-12 ENCOUNTER — Ambulatory Visit (INDEPENDENT_AMBULATORY_CARE_PROVIDER_SITE_OTHER): Payer: BC Managed Care – PPO | Admitting: Neurology

## 2022-09-12 DIAGNOSIS — G4733 Obstructive sleep apnea (adult) (pediatric): Secondary | ICD-10-CM

## 2022-09-12 DIAGNOSIS — R0683 Snoring: Secondary | ICD-10-CM | POA: Diagnosis not present

## 2022-09-12 DIAGNOSIS — J310 Chronic rhinitis: Secondary | ICD-10-CM

## 2022-09-12 DIAGNOSIS — E669 Obesity, unspecified: Secondary | ICD-10-CM

## 2022-09-12 DIAGNOSIS — I10 Essential (primary) hypertension: Secondary | ICD-10-CM

## 2022-09-12 DIAGNOSIS — K219 Gastro-esophageal reflux disease without esophagitis: Secondary | ICD-10-CM

## 2022-09-12 DIAGNOSIS — G4709 Other insomnia: Secondary | ICD-10-CM

## 2022-09-26 DIAGNOSIS — G4709 Other insomnia: Secondary | ICD-10-CM | POA: Insufficient documentation

## 2022-09-26 DIAGNOSIS — K219 Gastro-esophageal reflux disease without esophagitis: Secondary | ICD-10-CM

## 2022-09-26 DIAGNOSIS — R0683 Snoring: Secondary | ICD-10-CM | POA: Insufficient documentation

## 2022-09-26 HISTORY — DX: Gastro-esophageal reflux disease without esophagitis: K21.9

## 2022-09-26 NOTE — Procedures (Signed)
[facility] POLYSOMNOGRAPHY  INTERPRETATION REPORT   STUDY DATE:  09/12/2022     PATIENT NAME:  Austin Trevino         DATE OF BIRTH:  Jul 11, 1966  PATIENT ID:  [id number]    TYPE OF STUDY:  PSG  READING PHYSICIAN: Larey Seat, MD REFERRED BY: Dr Howard Pouch, DO SCORING TECHNICIAN: Richard Miu, RPSGT   HISTORY:  Daily Crate is a 57 year-old Male patient with known OSA, diagnosed in 2016, Obesity, GERD and HTN. He reports waking himself snoring and feeling congested. he has no problems to go to sleep but cannot stay asleep. He has a larger neck size, high BMI and reported some nocturia.  ADDITIONAL INFORMATION:  The Epworth Sleepiness Scale endorsed at  11 /24 points (scores above or equal to 10 are suggestive of hypersomnolence). FSS endorsed at 29 /63 points. Height: 72 in Weight: 241 lbs (BMI 32) Neck Size: 19 " MEDICATIONS: Norvasc, Proscar, Zestril, Multivitamin, Flomax, Protonix  TECHNICAL DESCRIPTION: A registered sleep technologist (RPSGT)  was in attendance for the duration of the recording.  Data collection, scoring, video monitoring, and reporting were performed in compliance with the AASM Manual for the Scoring of Sleep and Associated Events; (Hypopnea is scored based on the criteria listed in Section VIII D. 1a in the AASM Manual V2.6 using 3% oxygen desaturation and /or arousal rule).   SLEEP CONTINUITY AND SLEEP ARCHITECTURE:  Lights-out was at 21:43: and lights-on at  05:18:, with  7.6 minutes of recording time .  The Total sleep time (TST) was 246.5 minutes with a decreased sleep efficiency at 54.2%. There was 11.4% REM sleep. Mild to moderate snoring was recorded.   Sleep latency was increased at 65.0 minutes.  REM sleep latency was increased at 147.5 minutes. Of the total sleep time, the percentage of stage N1 sleep was 3.2%, stage N2 sleep was 83%, stage N3 sleep was 2.2%, and REM sleep was 11.4%. BODY POSITION:  TST was  divided  between the following sleep positions: supine sleep  for 25 minutes (10%), non-supine sleep for 222 minutes (90%); of this were : right 161 minutes (66%), left 60 minutes (24%).  There were 2 Stage R periods observed on this study night, 8 awakenings (i.e. transitions to Stage W from any sleep stage), and 30 total stage transitions.  Wake after sleep onset (WASO) time accounted for 80 minutes.   RESPIRATORY MONITORING:  Based on AASM criteria (using a 3% oxygen desaturation and /or arousal rule for scoring hypopneas), there were 0 apneas (0 obstructive; 0 central; 0 mixed), and 157 hypopneas. The total Apnea index was 0.0/h.  The Hypopnea index was 38.2/h . The AHI ( apnea-hypopnea index) was 38.2/h overall . The AHI was 81.6/h in supine, AHI was 47/h in non-supine; AHI of 47.1/h REM.  There were 0 respiratory effort-related arousals (RERAs).  OXIMETRY: Oxyhemoglobin Saturation Nadir during sleep was at  81% from a mean of 93%.  Of the Total sleep time (TST) hypoxemia (<89%) was present for only  4.9 minutes, or 2.0% of total sleep time.  LIMB MOVEMENTS: There were 118 periodic limb movements of sleep (28.7/hr), of which 0 (0.0/hr) were associated with an arousal. AROUSALS: There were 42 arousals in total, for an arousal index of 9/hour.  Of these, 31 were identified as respiratory-related arousals (8 /h), 0 were PLM-related arousals (0 /h), and 11 were non-specific arousals (3 /h). EEG:  PSG EEG was of normal amplitude and  frequency, with symmetric manifestation of sleep stages. EKG: The electrocardiogram documented  NSR .  The average heart rate during sleep was 53 bpm.  The heart rate during sleep varied between a minimum of 47 bpm and  a maximum of  72 bpm. AUDIO and VIDEO: no complex movements, vocalizations or phonetics recorded.   IMPRESSION: 1) Sleep disordered breathing was severe, with an AHI of 38.2/h which consisted solely of hypopneas. Obstructive Sleep hypopnea at a severe  degree, associated with snoring.   2) Sleep efficiency was poor at 54.2%.  It was difficult for this patient to go to sleep before midnight. From midnight on, sleep architecture was normal.  The SPLIT night AHI was not reached in 2 hours and the study was therefor run as a PSG.   RECOMMENDATIONS: 1) Positive airway pressure titration study is recommended and was ordered by the interpreting physician. I will prescribe an autotitration capable, heated CPAP device by [employer] or Hardin Negus, with a range of pressure setting from 6 through 18 cm water, 2 cm EPR and a nasal interface of patients choice, unless a FFM is tolerated. RV for compliance with me or Np after 30 days on CPA and before 90 days on CPAP are recorded.  DME to encourage patient to comply with CPAP and to offer changes in interface if needed.   Larey Seat, MD

## 2022-09-26 NOTE — Progress Notes (Signed)
The total Apnea index was 0.0/h.  The Hypopnea index was 38.2/h . The AHI ( apnea-hypopnea index) was 38.2/h overall . The AHI was 81.6/h in supine, AHI was 47/h in non-supine; AHI of 47.1/h REM.    RECOMMENDATIONS: 1) Positive airway pressure titration study is recommended and was ordered by the interpreting physician. I will prescribe an autotitration capable and heated CPAP device by [employer]Med or Hardin Negus, with a range -of -pressure -setting from 6 through 18 cm water, 2 cm EPR and a nasal interface of patients choice, unless a FFM is tolerated. RV for compliance with me or NP after 30 days on CPA and before 90 days on CPAP are recorded.  DME to encourage patient to comply with CPAP and to offer changes in interface if needed.   Larey Seat, MD   ________________________________________

## 2022-09-26 NOTE — Addendum Note (Signed)
Addended by: Larey Seat on: 09/26/2022 06:06 PM   Modules accepted: Orders

## 2022-09-27 ENCOUNTER — Telehealth: Payer: Self-pay | Admitting: *Deleted

## 2022-09-27 ENCOUNTER — Encounter: Payer: Self-pay | Admitting: Family Medicine

## 2022-09-27 DIAGNOSIS — G4733 Obstructive sleep apnea (adult) (pediatric): Secondary | ICD-10-CM | POA: Insufficient documentation

## 2022-09-27 NOTE — Telephone Encounter (Signed)
LVM for pt to call about sleep study results.

## 2022-09-27 NOTE — Telephone Encounter (Signed)
-----   Message from Larey Seat, MD sent at 09/26/2022  6:06 PM EDT ----- The total Apnea index was 0.0/h.  The Hypopnea index was 38.2/h . The AHI ( apnea-hypopnea index) was 38.2/h overall . The AHI was 81.6/h in supine, AHI was 47/h in non-supine; AHI of 47.1/h REM.    RECOMMENDATIONS: 1) Positive airway pressure titration study is recommended and was ordered by the interpreting physician. I will prescribe an autotitration capable and heated CPAP device by ResMed or Hardin Negus, with a range -of -pressure -setting from 6 through 18 cm water, 2 cm EPR and a nasal interface of patients choice, unless a FFM is tolerated. RV for compliance with me or NP after 30 days on CPA and before 90 days on CPAP are recorded.  DME to encourage patient to comply with CPAP and to offer changes in interface if needed.   Larey Seat, MD    ________________________________________

## 2022-09-28 ENCOUNTER — Telehealth: Payer: Self-pay | Admitting: Neurology

## 2022-09-28 ENCOUNTER — Ambulatory Visit (INDEPENDENT_AMBULATORY_CARE_PROVIDER_SITE_OTHER): Payer: BC Managed Care – PPO | Admitting: Family Medicine

## 2022-09-28 DIAGNOSIS — I10 Essential (primary) hypertension: Secondary | ICD-10-CM

## 2022-09-28 DIAGNOSIS — Z91199 Patient's noncompliance with other medical treatment and regimen due to unspecified reason: Secondary | ICD-10-CM

## 2022-09-28 DIAGNOSIS — E669 Obesity, unspecified: Secondary | ICD-10-CM

## 2022-09-28 DIAGNOSIS — Z23 Encounter for immunization: Secondary | ICD-10-CM

## 2022-09-28 NOTE — Progress Notes (Unsigned)
   Complete physical exam  Patient: Austin Trevino   DOB: 09/17/2001   56 y.o. Male  MRN: [id number]  Subjective:    No chief complaint on file.   Austin Trevino is a 56 y.o. male who presents today for a complete physical exam. She reports consuming a {diet types:17450} diet. {types:19826} She generally feels {DESC; WELL/FAIRLY WELL/POORLY:18703}. She reports sleeping {DESC; WELL/FAIRLY WELL/POORLY:18703}. She {does/does not:200015} have additional problems to discuss today.    Most recent fall risk assessment:    05/25/2022   10:42 AM  Fall Risk   Falls in the past year? 0  Number falls in past yr: 0  Injury with Fall? 0  Risk for fall due to : No Fall Risks  Follow up Falls evaluation completed     Most recent depression screenings:    05/25/2022   10:42 AM 04/15/2021   10:46 AM  PHQ 2/9 Scores  PHQ - 2 Score 0 0  PHQ- 9 Score 5     {VISON DENTAL STD PSA (Optional):27386}  {History (Optional):23778}  Patient Care Team: Jessup, Joy, NP as PCP - General (Nurse Practitioner)   Outpatient Medications Prior to Visit  Medication Sig   fluticasone (FLONASE) 50 MCG/ACT nasal spray Place 2 sprays into both nostrils in the morning and at bedtime. After 7 days, reduce to once daily.   norgestimate-ethinyl estradiol (SPRINTEC 28) 0.25-35 MG-MCG tablet Take 1 tablet by mouth daily.   Nystatin POWD Apply liberally to affected area 2 times per day   spironolactone (ALDACTONE) 100 MG tablet Take 1 tablet (100 mg total) by mouth daily.   No facility-administered medications prior to visit.    ROS        Objective:     There were no vitals taken for this visit. {Vitals History (Optional):23777}  Physical Exam   No results found for any visits on 06/30/22. {Show previous labs (optional):23779}    Assessment & Plan:    Routine Health Maintenance and Physical Exam  Immunization History  Administered Date(s) Administered   DTaP 12/01/1999, 01/27/2000,  04/06/2000, 12/21/2000, 07/06/2004   Hepatitis A 05/02/2008, 05/08/2009   Hepatitis B 09/18/1999, 10/26/1999, 04/06/2000   HiB (PRP-OMP) 12/01/1999, 01/27/2000, 04/06/2000, 12/21/2000   IPV 12/01/1999, 01/27/2000, 09/25/2000, 07/06/2004   Influenza,inj,Quad PF,6+ Mos 08/08/2014   Influenza-Unspecified 11/07/2012   MMR 09/25/2001, 07/06/2004   Meningococcal Polysaccharide 05/07/2012   Pneumococcal Conjugate-13 12/21/2000   Pneumococcal-Unspecified 04/06/2000, 06/20/2000   Tdap 05/07/2012   Varicella 09/25/2000, 05/02/2008    Health Maintenance  Topic Date Due   HIV Screening  Never done   Hepatitis C Screening  Never done   INFLUENZA VACCINE  06/28/2022   PAP-Cervical Cytology Screening  06/30/2022 (Originally 09/16/2020)   PAP SMEAR-Modifier  06/30/2022 (Originally 09/16/2020)   TETANUS/TDAP  06/30/2022 (Originally 05/07/2022)   HPV VACCINES  Discontinued   COVID-19 Vaccine  Discontinued    Discussed health benefits of physical activity, and encouraged her to engage in regular exercise appropriate for her age and condition.  Problem List Items Addressed This Visit   None Visit Diagnoses     Annual physical exam    -  Primary   Cervical cancer screening       Need for Tdap vaccination          No follow-ups on file.     Joy Jessup, NP   

## 2022-09-28 NOTE — Telephone Encounter (Signed)
Called patient to discuss sleep study results. No answer at this time. LVM for the patient to call back.   

## 2022-09-28 NOTE — Telephone Encounter (Signed)
-----   Message from Larey Seat, MD sent at 09/26/2022  6:06 PM EDT ----- The total Apnea index was 0.0/h.  The Hypopnea index was 38.2/h . The AHI ( apnea-hypopnea index) was 38.2/h overall . The AHI was 81.6/h in supine, AHI was 47/h in non-supine; AHI of 47.1/h REM.    RECOMMENDATIONS: 1) Positive airway pressure titration study is recommended and was ordered by the interpreting physician. I will prescribe an autotitration capable and heated CPAP device by ResMed or Hardin Negus, with a range -of -pressure -setting from 6 through 18 cm water, 2 cm EPR and a nasal interface of patients choice, unless a FFM is tolerated. RV for compliance with me or NP after 30 days on CPA and before 90 days on CPAP are recorded.  DME to encourage patient to comply with CPAP and to offer changes in interface if needed.   Larey Seat, MD    ________________________________________

## 2022-10-03 ENCOUNTER — Encounter: Payer: Self-pay | Admitting: Neurology

## 2022-10-03 NOTE — Telephone Encounter (Signed)
This is our 3rd attempt calling the patient. Advised the patient to give [place] a call back. Advised letter will also be sent to the pt

## 2022-10-04 NOTE — Telephone Encounter (Signed)
I called Austin Trevino. I advised Austin Trevino that Dr. Brett Fairy reviewed their sleep study results and found that Austin Trevino has sleep apnea. Dr. Brett Fairy recommends that Austin Trevino start CPAP. I reviewed PAP compliance expectations with the Austin Trevino. Austin Trevino is agreeable to starting a CPAP. I advised Austin Trevino that an order will be sent to a DME, [facility], and [facility] will call the Austin Trevino within about one week after they file with the Austin Trevino's insurance. [facility] will show the Austin Trevino how to use the machine, fit for masks, and troubleshoot the CPAP if needed. Provided Austin Trevino [facility] phone# [phone] in case they do not reach out in the next week and he can call. A follow up appt was made for insurance purposes with JM,NP on 01/04/23 at 3:45pm. Austin Trevino verbalized understanding to arrive 15 minutes early and bring their CPAP. Austin Trevino verbalized understanding of results. Austin Trevino had no questions at this time but was encouraged to call back if questions arise. I have sent the order to [place] and have received confirmation that they have received the order.

## 2022-10-06 ENCOUNTER — Encounter: Payer: Self-pay | Admitting: Family Medicine

## 2022-11-25 ENCOUNTER — Ambulatory Visit: Payer: BC Managed Care – PPO | Admitting: Family Medicine

## 2022-11-25 ENCOUNTER — Encounter: Payer: Self-pay | Admitting: Family Medicine

## 2022-11-25 VITALS — BP 105/67 | HR 59 | Temp 98.5°F | Ht 72.0 in | Wt 238.0 lb

## 2022-11-25 DIAGNOSIS — I10 Essential (primary) hypertension: Secondary | ICD-10-CM | POA: Diagnosis not present

## 2022-11-25 MED ORDER — LISINOPRIL 40 MG PO TABS: 40.0000 mg | ORAL_TABLET | Freq: Every day | ORAL | 1 refills | Status: DC

## 2022-11-25 MED ORDER — AMLODIPINE BESYLATE 5 MG PO TABS: 5.0000 mg | ORAL_TABLET | Freq: Every day | ORAL | 1 refills | Status: DC

## 2022-11-25 NOTE — Progress Notes (Signed)
Austin Trevino , 1966/06/25, 56 y.o., male MRN: [id number] Patient Care Team    Relationship Specialty Notifications Start End  Ma Hillock, DO PCP - General Family Medicine  06/29/15   Clance, Armando Reichert, MD Consulting Physician Pulmonary Disease  03/25/15   Gatha Mayer, MD Consulting Physician Gastroenterology  10/04/19     Chief Complaint  Patient presents with   Hypertension    Cmc; pt is fasting     Subjective: Austin Trevino is a 56 y.o. male present for chronic conditions management All past medical history, surgical history, allergies, family history, immunizations and social history were updated in  into the electronic medical record.  Hypertension:  Pt reports compliance with lisinopril 40 mg and amlodipine 5 mg QD. Patient denies chest pain, shortness of breath, dizziness or lower extremity edema.  Diet: low sodium     11/25/2022    8:54 AM 04/11/2022    8:11 AM 10/11/2021   11:03 AM 11/03/2020   10:41 AM 10/04/2019    8:06 AM  Depression screen PHQ 2/9  Decreased Interest 0 0 0 0 0  Down, Depressed, Hopeless 0 0 0 0 0  PHQ - 2 Score 0 0 0 0 0  Altered sleeping  3     Tired, decreased energy  1     Change in appetite  1     Feeling bad or failure about yourself   0     Trouble concentrating  0     Moving slowly or fidgety/restless  0     Suicidal thoughts  0     PHQ-9 Score  5       No Known Allergies Social History   Social History Narrative   Divorced, 3 sons in middle school/HS [place]).   Educ: BS management from [place].  Orig from [place].   Occupation: Nurse, learning disability for [employer].   No tob, alcohol intake occasional/social.   Past Medical History:  Diagnosis Date   COVID-19    GERD (gastroesophageal reflux disease)    Hematuria    Hx of colonic polyps - adenomas and ssp 02/18/2015   Hypertension    treated "years ago" but he did some TLC/lost wt and was able to get off meds.   Obesity, Class I, BMI 30-34.9    OSA  (obstructive sleep apnea) 01/2015   Suspected: eval by Dr. Gwenette Greet 02/2015, sleep study recommended.never completed.    Past Surgical History:  Procedure Laterality Date   COLONOSCOPY     COLONOSCOPY W/ POLYPECTOMY     Family History  Problem Relation Age of Onset   Arthritis Mother    Hypertension Mother    Colon polyps Mother    Hypertension Father    Myelodysplastic syndrome Father    Cancer Father    Neuropathy Sister        amyloidosis   Prostate cancer Paternal Grandfather    Asthma Paternal Grandmother    Colon cancer Neg Hx    Rectal cancer Neg Hx    Stomach cancer Neg Hx    Allergies as of 11/25/2022   No Known Allergies      Medication List        Accurate as of November 25, 2022  9:06 AM. If you have any questions, ask your nurse or doctor.          amLODipine 5 MG tablet Commonly known as: NORVASC Take 1 tablet (5 mg total) by mouth  daily.   finasteride 5 MG tablet Commonly known as: PROSCAR Take 5 mg by mouth daily.   lisinopril 40 MG tablet Commonly known as: ZESTRIL Take 1 tablet (40 mg total) by mouth daily.   multivitamin tablet Take 1 tablet by mouth daily.   tamsulosin 0.4 MG Caps capsule Commonly known as: FLOMAX Take 0.4 mg by mouth daily.   triamcinolone 55 MCG/ACT Aero nasal inhaler Commonly known as: NASACORT Place 2 sprays into the nose daily.        All past medical history, surgical history, allergies, family history, immunizations andmedications were updated in the EMR today and reviewed under the history and medication portions of their EMR.     ROS Negative, with the exception of above mentioned in HPI   Objective:  BP 105/67   Pulse (!) 59   Temp 98.5 F (36.9 C) (Oral)   Ht 6' (1.829 m)   Wt 238 lb (108 kg)   SpO2 96%   BMI 32.28 kg/m  Body mass index is 32.28 kg/m. Physical Exam Vitals and nursing note reviewed.  Constitutional:      General: He is not in acute distress.    Appearance: Normal  appearance. He is not ill-appearing, toxic-appearing or diaphoretic.  HENT:     Head: Normocephalic and atraumatic.  Eyes:     General: No scleral icterus.       Right eye: No discharge.        Left eye: No discharge.     Extraocular Movements: Extraocular movements intact.     Pupils: Pupils are equal, round, and reactive to light.  Cardiovascular:     Rate and Rhythm: Normal rate and regular rhythm.  Pulmonary:     Effort: Pulmonary effort is normal. No respiratory distress.     Breath sounds: Normal breath sounds. No wheezing, rhonchi or rales.  Musculoskeletal:     Cervical back: Neck supple.     Right lower leg: No edema.     Left lower leg: No edema.  Lymphadenopathy:     Cervical: No cervical adenopathy.  Skin:    General: Skin is dry.     Findings: No rash.  Neurological:     Mental Status: He is alert and oriented to person, place, and time. Mental status is at baseline.  Psychiatric:        Mood and Affect: Mood normal.        Behavior: Behavior normal.        Thought Content: Thought content normal.        Judgment: Judgment normal.     No results found. No results found. No results found for this or any previous visit (from the past 24 hour(s)).  Assessment/Plan: Austin Trevino is a 56 y.o. male present for OV for chronic condition management Essential hypertension/ Obesity (BMI 30-39.9) Stable Continue amlodipine to 5 mg qd Continue lisinopril 40 mg qd.  Heart healthy diet and routine exercise encouraged Labs due next visit F/u 5.5 mos.   Reviewed expectations re: course of current medical issues. Discussed self-management of symptoms. Outlined signs and symptoms indicating need for more acute intervention. Patient verbalized understanding and all questions were answered. Patient received an After-Visit Summary.    No orders of the defined types were placed in this encounter.  Meds ordered this encounter  Medications   amLODipine (NORVASC) 5 MG  tablet    Sig: Take 1 tablet (5 mg total) by mouth daily.    Dispense:  90 tablet  Refill:  1   lisinopril (ZESTRIL) 40 MG tablet    Sig: Take 1 tablet (40 mg total) by mouth daily.    Dispense:  90 tablet    Refill:  1   Referral Orders  No referral(s) requested today     Note is dictated utilizing voice recognition software. Although note has been proof read prior to signing, occasional typographical errors still can be missed. If any questions arise, please do not hesitate to call for verification.   electronically signed by:  Howard Pouch, DO  [place]

## 2022-11-25 NOTE — Patient Instructions (Signed)
No follow-ups on file.        Great to see you today.  I have refilled the medication(s) we provide.   If labs were collected, we will inform you of lab results once received either by echart message or telephone call.   - echart message- for normal results that have been seen by the patient already.   - telephone call: abnormal results or if patient has not viewed results in their echart.  

## 2022-11-29 ENCOUNTER — Ambulatory Visit: Payer: BC Managed Care – PPO | Admitting: Family Medicine

## 2022-11-29 DIAGNOSIS — G4733 Obstructive sleep apnea (adult) (pediatric): Secondary | ICD-10-CM | POA: Diagnosis not present

## 2022-12-05 ENCOUNTER — Telehealth: Payer: Self-pay

## 2022-12-30 DIAGNOSIS — G4733 Obstructive sleep apnea (adult) (pediatric): Secondary | ICD-10-CM | POA: Diagnosis not present

## 2023-01-04 ENCOUNTER — Ambulatory Visit: Payer: BC Managed Care – PPO | Admitting: Adult Health

## 2023-01-28 DIAGNOSIS — G4733 Obstructive sleep apnea (adult) (pediatric): Secondary | ICD-10-CM | POA: Diagnosis not present

## 2023-02-09 NOTE — Progress Notes (Signed)
[facility] [address] [place]. [place] [address] [phone]       OFFICE FOLLOW UP NOTE  Mr. Austin Trevino Date of Birth:  [birth year] Medical Record Number:  [id number]    Primary neurologist: Dr. Brett Fairy Reason for visit: Initial CPAP follow-up    SUBJECTIVE:   CHIEF COMPLAINT:  Chief Complaint  Patient presents with   Follow-up    Patient in room #3 and alone. Patient state he is well and stable, no new concerns.   Follow-up visit:  Prior visit: 07/13/2022 with Dr. Brett Fairy (initial consult visit)  Brief HPI:   Austin Trevino is a 57 y.o. male who is being seen for initial CPAP compliance visit.  Main complaints at initial visit was waking himself up snoring, feeling congested, and insomnia.  Underwent sleep study 09/12/2022 which showed severe sleep disordered breathing with an AHI of 38.2/h consisting solely of hypopneas and recommend initiation of AutoPap.  AutoPap set up date 11/29/2022.   Interval history:  Initial compliance report from the first 30 days showed 23 out of 30 usage days with 21 days greater than 4 hours for 70% compliance and residual AHI 0.8.  Reports tolerance gradually improving but has had some difficulty more recently tolerating due to increased nasal congestion.  Current use of nasal pillow which can be difficult to tolerate with increased congestion.  He attempted to switch out to full facemask through DME company but was told he needed to wait until April.  Initially using Nasacort with improvement of congestion but has been on backorder over the past month and has noticed worsening congestion.  Has noted improvement of snoring, daytime fatigue and sleep quality with CPAP use.   Epworth Sleepiness Scale 5/24 (prior to CPAP 11/24) Fatigue severity scale 23/63 (prior to CPAP 29/63)             ROS:   14 system review of systems performed and negative with exception of those listed in HPI  PMH:  Past Medical  History:  Diagnosis Date   COVID-19    GERD (gastroesophageal reflux disease)    GERD with apnea 09/26/2022   Hematuria    Hx of colonic polyps - adenomas and ssp 02/18/2015   Hypertension    treated "years ago" but he did some TLC/lost wt and was able to get off meds.   Obesity, Class I, BMI 30-34.9    OSA (obstructive sleep apnea) 01/2015   Suspected: eval by Dr. Gwenette Greet 02/2015, sleep study recommended.never completed.    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  Educ: BS management from [employer].  Orig from [place].   Occupation: Nurse, learning disability for [employer].   No tob, alcohol intake occasional/social.   Social Determinants of Health   Financial Resource Strain: Not on file  Food Insecurity: Not on file  Transportation Needs: Not on file  Physical Activity: Not on file  Stress: Not on file  Social Connections: Not on file  Intimate Partner Violence: Not on file    Family History:  Family History  Problem Relation Age of Onset   Arthritis Mother    Hypertension Mother    Colon polyps Mother    Hypertension Father    Myelodysplastic syndrome Father    Cancer Father    Neuropathy Sister         amyloidosis   Prostate cancer Paternal Grandfather    Asthma Paternal Grandmother    Colon cancer Neg Hx    Rectal cancer Neg Hx    Stomach cancer Neg Hx     Medications:   Current Outpatient Medications on File Prior to Visit  Medication Sig Dispense Refill   amLODipine (NORVASC) 5 MG tablet Take 1 tablet (5 mg total) by mouth daily. 90 tablet 1   finasteride (PROSCAR) 5 MG tablet Take 5 mg by mouth daily.     lisinopril (ZESTRIL) 40 MG tablet Take 1 tablet (40 mg total) by mouth daily. 90 tablet 1   Multiple Vitamin (MULTIVITAMIN) tablet Take 1 tablet by mouth daily.     tamsulosin (FLOMAX) 0.4 MG CAPS capsule Take 0.4 mg by mouth daily.     triamcinolone (NASACORT) 55 MCG/ACT AERO nasal inhaler Place 2 sprays into the nose daily. 1 each 12   Current Facility-Administered Medications on File Prior to Visit  Medication Dose Route Frequency Provider Last Rate Last Admin   0.9 %  sodium chloride infusion  500 mL Intravenous Once Gatha Mayer, MD        Allergies:  No Known Allergies    OBJECTIVE:  Physical Exam  Vitals:   02/13/23 1042  BP: 129/78  Pulse: 67  Weight: 236 lb 6.4 oz (107.2 kg)  Height: 6\' 1"  (1.854 m)   Body mass index is 31.19 kg/m. No results found.   General: well developed, well nourished, very pleasant middle-aged Caucasian male, seated, in no evident distress Head: head normocephalic and atraumatic.   Neck: supple with no carotid or supraclavicular bruits Cardiovascular: regular rate and rhythm, no murmurs Musculoskeletal: no deformity Skin:  no rash/petichiae Vascular:  Normal pulses all extremities   Neurologic Exam Mental Status: Awake and fully alert. Oriented to place and time. Recent and remote memory intact. Attention span, concentration and fund of knowledge appropriate. Mood and affect appropriate.  Cranial Nerves: Pupils equal, briskly reactive to light. Extraocular movements full without nystagmus. Visual fields full to  confrontation. Hearing intact. Facial sensation intact. Face, tongue, palate moves normally and symmetrically.  Motor: Normal bulk and tone. Normal strength in all tested extremity muscles Sensory.: intact to touch , pinprick , position and vibratory sensation.  Coordination: Rapid alternating movements normal in all extremities. Finger-to-nose and heel-to-shin performed accurately bilaterally. Gait and Station: Arises from chair without difficulty. Stance is normal. Gait demonstrates normal stride length and balance without use of AD.  Reflexes: 1+ and symmetric. Toes downgoing.         ASSESSMENT/PLAN: Kolsten Bessler is a 57 y.o. year old male    OSA on CPAP : Initial compliance report showed satisfactory usage and optimal  residual AHI.  Greater difficulty using more recently due to nasal congestion.  He will follow-up with his pharmacy regarding back order on Nasacort as this significantly helped his congestion, he will call if this remains on backorder to look for other local pharmacies that he can obtain this from. Will also place order to DME company to try full face mask as nasal pillow mask difficulty continues with congestion.  Discussed importance of increasing nightly usage with ensuring greater than 4 hours nightly for optimal benefit and per insurance purposes.  Continue to follow with DME company for any needed supplies or CPAP related concerns     Follow up in 6 months or call earlier if needed   CC:  PCP: Kuneff, Renee A, DO    I spent 23 minutes of face-to-face and non-face-to-face time with patient.  This included previsit chart review, lab review, study review, order entry, electronic health record documentation, patient education regarding diagnosis of sleep apnea with review and discussion of compliance report and answered all other questions to patient's satisfaction   Frann Rider, [facility]  [facility] [address] [place], [place] [address]  Phone [phone] Fax [phone] Note: This document was prepared with digital dictation and possible smart phrase technology. Any transcriptional errors that result from this process are unintentional.

## 2023-02-13 ENCOUNTER — Ambulatory Visit: Payer: BC Managed Care – PPO | Admitting: Adult Health

## 2023-02-13 ENCOUNTER — Encounter: Payer: Self-pay | Admitting: Adult Health

## 2023-02-13 VITALS — BP 129/78 | HR 67 | Ht 73.0 in | Wt 236.4 lb

## 2023-02-13 DIAGNOSIS — G4733 Obstructive sleep apnea (adult) (pediatric): Secondary | ICD-10-CM

## 2023-02-13 DIAGNOSIS — R0981 Nasal congestion: Secondary | ICD-10-CM

## 2023-02-28 DIAGNOSIS — G4733 Obstructive sleep apnea (adult) (pediatric): Secondary | ICD-10-CM | POA: Diagnosis not present

## 2023-03-30 DIAGNOSIS — G4733 Obstructive sleep apnea (adult) (pediatric): Secondary | ICD-10-CM | POA: Diagnosis not present

## 2023-04-30 DIAGNOSIS — G4733 Obstructive sleep apnea (adult) (pediatric): Secondary | ICD-10-CM | POA: Diagnosis not present

## 2023-05-12 ENCOUNTER — Encounter: Payer: Self-pay | Admitting: Family Medicine

## 2023-05-12 ENCOUNTER — Ambulatory Visit (INDEPENDENT_AMBULATORY_CARE_PROVIDER_SITE_OTHER): Payer: BC Managed Care – PPO | Admitting: Family Medicine

## 2023-05-12 VITALS — BP 132/80 | HR 60 | Temp 98.1°F | Ht 72.0 in | Wt 236.2 lb

## 2023-05-12 DIAGNOSIS — G4733 Obstructive sleep apnea (adult) (pediatric): Secondary | ICD-10-CM | POA: Diagnosis not present

## 2023-05-12 DIAGNOSIS — Z86010 Personal history of colonic polyps: Secondary | ICD-10-CM | POA: Diagnosis not present

## 2023-05-12 DIAGNOSIS — E559 Vitamin D deficiency, unspecified: Secondary | ICD-10-CM | POA: Diagnosis not present

## 2023-05-12 DIAGNOSIS — M25511 Pain in right shoulder: Secondary | ICD-10-CM | POA: Diagnosis not present

## 2023-05-12 DIAGNOSIS — I10 Essential (primary) hypertension: Secondary | ICD-10-CM | POA: Diagnosis not present

## 2023-05-12 DIAGNOSIS — Z0000 Encounter for general adult medical examination without abnormal findings: Secondary | ICD-10-CM

## 2023-05-12 DIAGNOSIS — E669 Obesity, unspecified: Secondary | ICD-10-CM | POA: Diagnosis not present

## 2023-05-12 LAB — CBC WITH DIFFERENTIAL/PLATELET
Basophils Absolute: 0 10*3/uL (ref 0.0–0.1)
Basophils Relative: 0.6 % (ref 0.0–3.0)
Eosinophils Absolute: 0.3 10*3/uL (ref 0.0–0.7)
Eosinophils Relative: 3.9 % (ref 0.0–5.0)
HCT: 45.9 % (ref 39.0–52.0)
Hemoglobin: 15.4 g/dL (ref 13.0–17.0)
Lymphocytes Relative: 20.6 % (ref 12.0–46.0)
Lymphs Abs: 1.4 10*3/uL (ref 0.7–4.0)
MCHC: 33.6 g/dL (ref 30.0–36.0)
MCV: 94 fl (ref 78.0–100.0)
Monocytes Absolute: 0.7 10*3/uL (ref 0.1–1.0)
Monocytes Relative: 10.2 % (ref 3.0–12.0)
Neutro Abs: 4.4 10*3/uL (ref 1.4–7.7)
Neutrophils Relative %: 64.7 % (ref 43.0–77.0)
Platelets: 207 10*3/uL (ref 150.0–400.0)
RBC: 4.88 Mil/uL (ref 4.22–5.81)
RDW: 13.8 % (ref 11.5–15.5)
WBC: 6.8 10*3/uL (ref 4.0–10.5)

## 2023-05-12 LAB — COMPREHENSIVE METABOLIC PANEL
ALT: 34 U/L (ref 0–53)
AST: 20 U/L (ref 0–37)
Albumin: 4.1 g/dL (ref 3.5–5.2)
Alkaline Phosphatase: 60 U/L (ref 39–117)
BUN: 16 mg/dL (ref 6–23)
CO2: 27 mEq/L (ref 19–32)
Calcium: 9.4 mg/dL (ref 8.4–10.5)
Chloride: 104 mEq/L (ref 96–112)
Creatinine, Ser: 1.05 mg/dL (ref 0.40–1.50)
GFR: 78.05 mL/min (ref >60.00)
Glucose, Bld: 88 mg/dL (ref 70–99)
Potassium: 4 mEq/L (ref 3.5–5.1)
Sodium: 140 mEq/L (ref 135–145)
Total Bilirubin: 0.8 mg/dL (ref 0.2–1.2)
Total Protein: 6.9 g/dL (ref 6.0–8.3)

## 2023-05-12 LAB — LIPID PANEL
Cholesterol: 191 mg/dL (ref 0–200)
HDL: 40.4 mg/dL (ref >39.00)
LDL Cholesterol: 116 mg/dL — ABNORMAL HIGH (ref 0–99)
NonHDL: 150.61
Total CHOL/HDL Ratio: 5
Triglycerides: 174 mg/dL — ABNORMAL HIGH (ref 0.0–149.0)
VLDL: 34.8 mg/dL (ref 0.0–40.0)

## 2023-05-12 LAB — VITAMIN D 25 HYDROXY (VIT D DEFICIENCY, FRACTURES): VITD: 29.46 ng/mL — ABNORMAL LOW (ref 30.00–100.00)

## 2023-05-12 LAB — TSH: TSH: 2.3 u[IU]/mL (ref 0.35–5.50)

## 2023-05-12 LAB — HEMOGLOBIN A1C: Hgb A1c MFr Bld: 5.4 % (ref 4.6–6.5)

## 2023-05-12 MED ORDER — LISINOPRIL 40 MG PO TABS: 40.0000 mg | ORAL_TABLET | Freq: Every day | ORAL | 1 refills | Status: DC

## 2023-05-12 MED ORDER — AMLODIPINE BESYLATE 5 MG PO TABS: 5.0000 mg | ORAL_TABLET | Freq: Every day | ORAL | 1 refills | Status: DC

## 2023-05-12 NOTE — Progress Notes (Signed)
Austin Trevino , 1966/08/17, 57 y.o., male MRN: [id number] Patient Care Team    Relationship Specialty Notifications Start End  Natalia Leatherwood, DO PCP - General Family Medicine  06/29/15   Clance, Maree Krabbe, MD Consulting Physician Pulmonary Disease  03/25/15   Iva Boop, MD Consulting Physician Gastroenterology  10/04/19     Chief Complaint  Patient presents with   Annual Exam    Pt is fasting     Subjective: Austin Trevino is a 57 y.o. male present for physical and chronic conditions combo appt. All past medical history, surgical history, allergies, family history, immunizations and social history were updated in  into the electronic medical record.  Health maintenance:  Colonoscopy: completed 11/2019, by Dr. Leone Payor, . follow up 5 yrs Immunizations: tdap UTD 2018, Influenza UTD 2023 (encouraged yearly), Shingrix completed,   Infectious disease screening: HIV declined and Hep C completed  PSA: pt follows with urology Assistive device: none Oxygen WUJ:[id number] Patient has a Dental home. Hospitalizations/ED visits: reviewed  Hypertension:  Pt reports compliance with lisinopril 40 mg and amlodipine 5 mg QD. Patient denies chest pain, shortness of breath, dizziness or lower extremity edema.  Diet: low sodium     05/12/2023    8:46 AM 11/25/2022    8:54 AM 04/11/2022    8:11 AM 10/11/2021   11:03 AM 11/03/2020   10:41 AM  Depression screen PHQ 2/9  Decreased Interest 0 0 0 0 0  Down, Depressed, Hopeless 0 0 0 0 0  PHQ - 2 Score 0 0 0 0 0  Altered sleeping   3    Tired, decreased energy   1    Change in appetite   1    Feeling bad or failure about yourself    0    Trouble concentrating   0    Moving slowly or fidgety/restless   0    Suicidal thoughts   0    PHQ-9 Score   5      No Known Allergies Social History   Social History Narrative   Divorced, 3 sons in middle school/HS [place] [employer]).   Educ: BS management from [employer].  Orig from [place].    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   No tob, alcohol intake occasional/social.   Past Medical History:  Diagnosis Date   COVID-19    GERD (gastroesophageal reflux disease)    GERD with apnea 09/26/2022   Hematuria    Hx of colonic polyps - adenomas and ssp 02/18/2015   Hypertension    treated "years ago" but he did some TLC/lost wt and was able to get off meds.   Obesity, Class I, BMI 30-34.9    OSA (obstructive sleep apnea) 01/2015   Suspected: eval by Dr. Shelle Iron 02/2015, sleep study recommended.never completed.    Synovial cyst 08/10/2021   Past Surgical History:  Procedure Laterality Date   COLONOSCOPY     COLONOSCOPY W/ POLYPECTOMY     Family History  Problem Relation Age of Onset   Arthritis Mother    Hypertension Mother    Colon polyps Mother    Hypertension Father    Myelodysplastic syndrome Father    Cancer Father    Neuropathy Sister        amyloidosis   Prostate cancer Paternal Grandfather    Asthma Paternal Grandmother    Colon cancer Neg Hx    Rectal cancer Neg Hx    Stomach cancer Neg Hx  Allergies as of 05/12/2023   No Known Allergies      Medication List        Accurate as of May 12, 2023  9:04 AM. If you have any questions, ask your nurse or doctor.          amLODipine 5 MG tablet Commonly known as: NORVASC Take 1 tablet (5 mg total) by mouth daily.   finasteride 5 MG tablet Commonly known as: PROSCAR Take 5 mg by mouth daily.   lisinopril 40 MG tablet Commonly known as: ZESTRIL Take 1 tablet (40 mg total) by mouth daily.   multivitamin tablet Take 1 tablet by mouth daily.   tamsulosin 0.4 MG Caps capsule Commonly known as: FLOMAX Take 0.4 mg by mouth daily.   triamcinolone 55 MCG/ACT Aero nasal inhaler Commonly known as: NASACORT Place 2 sprays into the nose daily.        All past medical history, surgical history, allergies, family history, immunizations andmedications were updated in the EMR today and  reviewed under the history and medication portions of their EMR.     ROS Negative, with the exception of above mentioned in HPI  Objective:  BP 132/80   Pulse 60   Temp 98.1 F (36.7 C)   Ht 6' (1.829 m)   Wt 236 lb 3.2 oz (107.1 kg)   SpO2 95%   BMI 32.03 kg/m  Body mass index is 32.03 kg/m. Physical Exam Constitutional:      General: He is not in acute distress.    Appearance: Normal appearance. He is obese. He is not ill-appearing, toxic-appearing or diaphoretic.  HENT:     Head: Normocephalic and atraumatic.     Right Ear: Tympanic membrane, ear canal and external ear normal. There is no impacted cerumen.     Left Ear: Tympanic membrane, ear canal and external ear normal. There is no impacted cerumen.     Nose: Nose normal. No congestion or rhinorrhea.     Mouth/Throat:     Mouth: Mucous membranes are moist.     Pharynx: Oropharynx is clear. No oropharyngeal exudate or posterior oropharyngeal erythema.  Eyes:     General: No scleral icterus.       Right eye: No discharge.        Left eye: No discharge.     Extraocular Movements: Extraocular movements intact.     Pupils: Pupils are equal, round, and reactive to light.  Cardiovascular:     Rate and Rhythm: Normal rate and regular rhythm.     Pulses: Normal pulses.     Heart sounds: Normal heart sounds. No murmur heard.    No friction rub. No gallop.  Pulmonary:     Effort: Pulmonary effort is normal. No respiratory distress.     Breath sounds: Normal breath sounds. No stridor. No wheezing, rhonchi or rales.  Chest:     Chest wall: No tenderness.  Abdominal:     General: Abdomen is flat. Bowel sounds are normal. There is no distension.     Palpations: Abdomen is soft. There is no mass.     Tenderness: There is no abdominal tenderness. There is no right CVA tenderness, left CVA tenderness, guarding or rebound.     Hernia: No hernia is present.  Musculoskeletal:        General: No swelling or tenderness. Normal range  of motion.     Cervical back: Normal range of motion and neck supple.     Right lower leg: No  edema.     Left lower leg: No edema.  Lymphadenopathy:     Cervical: No cervical adenopathy.  Skin:    General: Skin is warm and dry.     Coloration: Skin is not jaundiced.     Findings: No bruising, lesion or rash.  Neurological:     General: No focal deficit present.     Mental Status: He is alert and oriented to person, place, and time. Mental status is at baseline.     Cranial Nerves: No cranial nerve deficit.     Sensory: No sensory deficit.     Motor: No weakness.     Coordination: Coordination normal.     Gait: Gait normal.     Deep Tendon Reflexes: Reflexes normal.  Psychiatric:        Mood and Affect: Mood normal.        Behavior: Behavior normal.        Thought Content: Thought content normal.        Judgment: Judgment normal.     No results found. No results found. No results found for this or any previous visit (from the past 24 hour(s)).  Assessment/Plan: Austin Trevino is a 57 y.o. male present for OV for annual physical and management of chronic conditions combination appointment Essential hypertension/ Obesity (BMI 30-39.9) Stable Continue amlodipine to 5 mg qd Continue lisinopril 40 mg qd.   - CBC with Differential/Platelet - Comprehensive metabolic panel - Hemoglobin A1c - TSH - Lipid panel Hx of colonic polyps - adenomas and ssp 2026 due Right shoulder recurrent injury: Currently ok. Sounds like rotator cuff has been injured/strained or impingement that he continues ot injure.  Referral to ortho (dr. Thomasena Edis requested by pt)  Vitamin D deficiency - Vitamin D (25 hydroxy)   Routine general medical examination at a health care facility Routine general medical examination at a health care facility Patient was encouraged to exercise greater than 150 minutes a week. Patient was encouraged to choose a diet filled with fresh fruits and vegetables, and lean  meats. AVS provided to patient today for education/recommendation on gender specific health and safety maintenance. Colonoscopy: completed 11/2019, by Dr. Leone Payor, . follow up 5 yrs Immunizations: tdap UTD 2018, Influenza UTD 2023 (encouraged yearly), Shingrix completed,   Infectious disease screening: HIV declined and Hep C completed  PSA: pt follows with urology  Reviewed expectations re: course of current medical issues. Discussed self-management of symptoms. Outlined signs and symptoms indicating need for more acute intervention. Patient verbalized understanding and all questions were answered. Patient received an After-Visit Summary.    Orders Placed This Encounter  Procedures   CBC with Differential/Platelet   Comprehensive metabolic panel   Hemoglobin A1c   TSH   Lipid panel   Vitamin D (25 hydroxy)   Ambulatory referral to Orthopedic Surgery   Meds ordered this encounter  Medications   amLODipine (NORVASC) 5 MG tablet    Sig: Take 1 tablet (5 mg total) by mouth daily.    Dispense:  90 tablet    Refill:  1   lisinopril (ZESTRIL) 40 MG tablet    Sig: Take 1 tablet (40 mg total) by mouth daily.    Dispense:  90 tablet    Refill:  1   Referral Orders         Ambulatory referral to Orthopedic Surgery       Note is dictated utilizing voice recognition software. Although note has been proof read prior to signing, occasional typographical  errors still can be missed. If any questions arise, please do not hesitate to call for verification.   electronically signed by:  Howard Pouch, DO  [place]

## 2023-05-12 NOTE — Patient Instructions (Addendum)
Return in about 24 weeks (around 10/27/2023) for Routine chronic condition follow-up.        Great to see you today.  I have refilled the medication(s) we provide.   If labs were collected, we will inform you of lab results once received either by echart message or telephone call.   - echart message- for normal results that have been seen by the patient already.   - telephone call: abnormal results or if patient has not viewed results in their echart.     

## 2023-05-30 DIAGNOSIS — G4733 Obstructive sleep apnea (adult) (pediatric): Secondary | ICD-10-CM | POA: Diagnosis not present

## 2023-06-02 DIAGNOSIS — M25511 Pain in right shoulder: Secondary | ICD-10-CM | POA: Diagnosis not present

## 2023-06-30 DIAGNOSIS — G4733 Obstructive sleep apnea (adult) (pediatric): Secondary | ICD-10-CM | POA: Diagnosis not present

## 2023-07-31 DIAGNOSIS — G4733 Obstructive sleep apnea (adult) (pediatric): Secondary | ICD-10-CM | POA: Diagnosis not present

## 2023-08-07 NOTE — Progress Notes (Unsigned)
[facility] [address] [place]. [place] [address] [phone]       OFFICE FOLLOW UP NOTE  Mr. Austin Trevino Date of Birth:  [birth year] Medical Record Number:  [id number]    Primary neurologist: Dr. Vickey Huger Reason for visit: Initial CPAP follow-up    SUBJECTIVE:   CHIEF COMPLAINT:  No chief complaint on file.  Follow-up visit:  Prior visit: 02/13/2023  Brief HPI:   Austin Trevino is a 57 y.o. male who is being seen for initial CPAP compliance visit.  Main complaints at initial visit was waking himself up snoring, feeling congested, and insomnia.  Underwent sleep study 09/12/2022 which showed severe sleep disordered breathing with an AHI of 38.2/h consisting solely of hypopneas and recommend initiation of AutoPap.  AutoPap set up date 11/29/2022.  At prior visit, compliance at 70% with optimal residual AHI. Reported some difficulty tolerating due to nasal congestion but did note overall improvement of snoring, daytime fatigue and sleep quality.    Interval history:      Initial compliance report from the first 30 days showed 23 out of 30 usage days with 21 days greater than 4 hours for 70% compliance and residual AHI 0.8.  Reports tolerance gradually improving but has had some difficulty more recently tolerating due to increased nasal congestion.  Current use of nasal pillow which can be difficult to tolerate with increased congestion.  He attempted to switch out to full facemask through DME company but was told he needed to wait until April.  Initially using Nasacort with improvement of congestion but has been on backorder over the past month and has noticed worsening congestion.  Has noted improvement of snoring, daytime fatigue and sleep quality with CPAP use.   Epworth Sleepiness Scale 5/24 (prior to CPAP 11/24) Fatigue severity scale 23/63 (prior to CPAP 29/63)             ROS:   14 system review of systems performed and negative with  exception of those listed in HPI  PMH:  Past Medical History:  Diagnosis Date   COVID-19    GERD (gastroesophageal reflux disease)    GERD with apnea 09/26/2022   Hematuria    Hx of colonic polyps - adenomas and ssp 02/18/2015   Hypertension    treated "years ago" but he did some TLC/lost wt and was able to get off meds.   Obesity, Class I, BMI 30-34.9    OSA (obstructive sleep apnea) 01/2015   Suspected: eval by Dr. Shelle Iron 02/2015, sleep study recommended.never completed.    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   Educ: BS management from [place].  Orig from [employer].   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   No tob, alcohol intake occasional/social.   Social Determinants of Health   Financial Resource Strain: Not on file  Food Insecurity: Not on file  Transportation Needs: Not on file  Physical Activity: Not on file  Stress: Not on file  Social Connections: Not on file  Intimate Partner Violence: Not on file    Family History:  Family History  Problem Relation Age of Onset   Arthritis Mother    Hypertension Mother    Colon polyps Mother    Hypertension Father    Myelodysplastic  syndrome Father    Cancer Father    Neuropathy Sister        amyloidosis   Prostate cancer Paternal Grandfather    Asthma Paternal Grandmother    Colon cancer Neg Hx    Rectal cancer Neg Hx    Stomach cancer Neg Hx     Medications:   Current Outpatient Medications on File Prior to Visit  Medication Sig Dispense Refill   amLODipine (NORVASC) 5 MG tablet Take 1 tablet (5 mg total) by mouth daily. 90 tablet 1   finasteride (PROSCAR) 5 MG tablet Take 5 mg by mouth daily.     lisinopril (ZESTRIL) 40 MG tablet Take 1 tablet (40 mg total) by mouth daily. 90 tablet 1   Multiple Vitamin (MULTIVITAMIN) tablet Take 1 tablet by mouth daily.     tamsulosin (FLOMAX) 0.4 MG CAPS capsule Take 0.4 mg by mouth daily.     triamcinolone (NASACORT) 55 MCG/ACT AERO nasal inhaler Place 2 sprays into the nose daily. 1 each 12   No current facility-administered medications on file prior to visit.    Allergies:  No Known Allergies    OBJECTIVE:  Physical Exam  There were no vitals filed for this visit.  There is no height or weight on file to calculate BMI. No results found.   General: well developed, well nourished, very pleasant middle-aged Caucasian male, seated, in no evident distress Head: head normocephalic and atraumatic.   Neck: supple with no carotid or supraclavicular bruits Cardiovascular: regular rate and rhythm, no murmurs Musculoskeletal: no deformity Skin:  no rash/petichiae Vascular:  Normal pulses all extremities   Neurologic Exam Mental Status: Awake and fully alert. Oriented to place and time. Recent and remote memory intact. Attention span, concentration and fund of knowledge appropriate. Mood and affect appropriate.  Cranial Nerves: Pupils equal, briskly reactive to light. Extraocular movements full without nystagmus. Visual fields full to confrontation. Hearing intact. Facial sensation intact. Face, tongue, palate moves normally and symmetrically.  Motor: Normal bulk and  tone. Normal strength in all tested extremity muscles Sensory.: intact to touch , pinprick , position and vibratory sensation.  Coordination: Rapid alternating movements normal in all extremities. Finger-to-nose and heel-to-shin performed accurately bilaterally. Gait and Station: Arises from chair without difficulty. Stance is normal. Gait demonstrates normal stride length and balance without use of AD.  Reflexes: 1+ and symmetric. Toes downgoing.         ASSESSMENT/PLAN: Clayburn Garbett is a 57 y.o. year old male    OSA on CPAP : Initial compliance report showed satisfactory usage and optimal residual AHI.  Greater difficulty using more recently due to nasal congestion.  He will follow-up with his pharmacy regarding back order on Nasacort as this significantly helped his congestion, he will call if this remains  on backorder to look for other local pharmacies that he can obtain this from. Will also place order to DME company to try full face mask as nasal pillow mask difficulty continues with congestion.  Discussed importance of increasing nightly usage with ensuring greater than 4 hours nightly for optimal benefit and per insurance purposes.  Continue to follow with DME company for any needed supplies or CPAP related concerns     Follow up in 6 months or call earlier if needed   CC:  PCP: Kuneff, Renee A, DO    I spent 23 minutes of face-to-face and non-face-to-face time with patient.  This included previsit chart review, lab review, study review, order entry, electronic health record documentation, patient education regarding diagnosis of sleep apnea with review and discussion of compliance report and answered all other questions to patient's satisfaction   Ihor Austin, [facility]  [facility] [address] [place], [place] [address]  Phone [phone] Fax [phone] Note: This document was prepared with digital dictation and possible smart  phrase technology. Any transcriptional errors that result from this process are unintentional.

## 2023-08-08 ENCOUNTER — Encounter: Payer: Self-pay | Admitting: Adult Health

## 2023-08-08 ENCOUNTER — Ambulatory Visit: Payer: BC Managed Care – PPO | Admitting: Adult Health

## 2023-08-08 VITALS — BP 133/80 | HR 67 | Ht 72.0 in | Wt 242.0 lb

## 2023-08-08 DIAGNOSIS — G4700 Insomnia, unspecified: Secondary | ICD-10-CM | POA: Diagnosis not present

## 2023-08-08 DIAGNOSIS — G4733 Obstructive sleep apnea (adult) (pediatric): Secondary | ICD-10-CM

## 2023-08-08 MED ORDER — TRAZODONE HCL 50 MG PO TABS: ORAL_TABLET | ORAL | 5 refills | Status: DC

## 2023-08-08 NOTE — Patient Instructions (Addendum)
Recommend trying trazodone 25-50mg  nightly as needed to help with insomnia  Ensure you are using your CPAP nightly with ensuring greater than 4 hours per night Will send order to DME [facility] to request being fitting for full face mask  Continue Nasacort as needed for congestion    Follow up in 6 months or call earlier if needed

## 2023-08-30 DIAGNOSIS — G4733 Obstructive sleep apnea (adult) (pediatric): Secondary | ICD-10-CM | POA: Diagnosis not present

## 2023-09-28 DIAGNOSIS — R351 Nocturia: Secondary | ICD-10-CM | POA: Diagnosis not present

## 2023-09-28 DIAGNOSIS — N401 Enlarged prostate with lower urinary tract symptoms: Secondary | ICD-10-CM | POA: Diagnosis not present

## 2023-09-28 LAB — PSA: PSA: 0.47

## 2023-10-19 ENCOUNTER — Encounter: Payer: Self-pay | Admitting: Urgent Care

## 2023-10-19 ENCOUNTER — Ambulatory Visit: Payer: BC Managed Care – PPO | Admitting: Urgent Care

## 2023-10-19 VITALS — BP 125/74 | HR 63 | Temp 97.8°F | Wt 239.0 lb

## 2023-10-19 DIAGNOSIS — B86 Scabies: Secondary | ICD-10-CM | POA: Diagnosis not present

## 2023-10-19 MED ORDER — PERMETHRIN 5 % EX CREA: 1.0000 | TOPICAL_CREAM | Freq: Once | CUTANEOUS | 1 refills | Status: AC

## 2023-10-19 NOTE — Progress Notes (Signed)
Established Patient Office Visit  Subjective:  Patient ID: Austin Trevino, male    DOB: [birth year]  Age: 57 y.o. MRN: [id number]  Chief Complaint  Patient presents with   Rash    Rash near belt line that's been there for about a month. He states the rash does itch. Some of his family members did get diagnosed with scabies.    Pleasant 57yo male presents today due to concerns regarding an itchy rash. States it started out as a mild itch about one month ago, primarily to his R hip at that time. Over the past few weeks, it has progressed to itching and red papules along his belt line, and now involving both hips. He noted this morning the development of the same rash to his R wrist. At the time of rash development, he states he was staying in a cabin and camping on a remote [place]. His son and another individual on the camping trip also had the same rash. His son was dx with scabies, and admits the rash resolved s/p tx with permethrin. Pt has tried OTC calamine lotion and other creams without resolution. Pt denies any additional c/o today.  Rash    Patient Active Problem List   Diagnosis Date Noted   OSA on CPAP 09/27/2022   Obesity (BMI 30-39.9) 04/01/2019   Essential hypertension 01/07/2019   Hx of colonic polyps - adenomas and ssp 02/18/2015   Past Medical History:  Diagnosis Date   COVID-19    GERD (gastroesophageal reflux disease)    GERD with apnea 09/26/2022   Hematuria    Hx of colonic polyps - adenomas and ssp 02/18/2015   Hypertension    treated "years ago" but he did some TLC/lost wt and was able to get off meds.   Obesity, Class I, BMI 30-34.9    OSA (obstructive sleep apnea) 01/2015   Suspected: eval by Dr. Shelle Iron 02/2015, sleep study recommended.never completed.    Synovial cyst 08/10/2021   Social History   Tobacco Use   Smoking status: Never   Smokeless tobacco: Never  Vaping Use   Vaping status: Never Used  Substance Use Topics   Alcohol use: No     Alcohol/week: 0.0 standard drinks of alcohol    Comment: socially but rare   Drug use: No      ROS: as noted in HPI  Objective:     BP 125/74   Pulse 63   Temp 97.8 F (36.6 C) (Oral)   Wt 239 lb (108.4 kg)   SpO2 96%   BMI 32.41 kg/m  BP Readings from Last 3 Encounters:  10/19/23 125/74  08/08/23 133/80  05/12/23 132/80   Wt Readings from Last 3 Encounters:  10/19/23 239 lb (108.4 kg)  08/08/23 242 lb (109.8 kg)  05/12/23 236 lb 3.2 oz (107.1 kg)      Physical Exam Vitals and nursing note reviewed.  Constitutional:      General: He is not in acute distress.    Appearance: Normal appearance. He is not ill-appearing, toxic-appearing or diaphoretic.  HENT:     Head: Normocephalic and atraumatic.  Eyes:     General: No scleral icterus.       Right eye: No discharge.        Left eye: No discharge.     Extraocular Movements: Extraocular movements intact.     Pupils: Pupils are equal, round, and reactive to light.  Cardiovascular:     Rate and Rhythm: Normal rate.  Pulmonary:     Effort: Pulmonary effort is normal. No respiratory distress.  Skin:    General: Skin is warm and dry.     Findings: Rash (papular rash with surrounding erythema, some scaling/ flaking, no drainage or warmth) present.       Neurological:     Mental Status: He is alert.      No results found for any visits on 10/19/23.    The 10-year ASCVD risk score (Arnett DK, et al., 2019) is: 10%  Assessment & Plan:  Scabies -     Permethrin; Apply 1 Application topically once for 1 dose. Repeat in 7 days ONLY if needed  Dispense: 60 g; Refill: 1  Sx most consistent with scabies, particularly given distribution pattern and known exposure. Will start topical permethrin. May repeat in 7 days if needed. Additional tx to clothing and bedding discussed. Handouts provided. RTC in 7 days if not resolved.  No follow-ups on file.   Maretta Bees, PA

## 2023-10-19 NOTE — Patient Instructions (Signed)
Please read the attached handout on scabies.  Apply the cream to your entire body, from neck down. Leave on for 8-12 hours, then rinse off. You may repeat a topical application in 7 days if needed, however this is rarely indicated.  Please clean all your clothes, sheets and towels with very hot water to kill any mites.  Return for recheck if you continue to have symptoms, or any change in symptoms after 7 days.

## 2023-11-01 ENCOUNTER — Encounter: Payer: Self-pay | Admitting: Family Medicine

## 2023-11-01 ENCOUNTER — Ambulatory Visit: Payer: BC Managed Care – PPO | Admitting: Family Medicine

## 2023-11-01 VITALS — BP 108/70 | HR 59 | Temp 98.5°F | Wt 236.0 lb

## 2023-11-01 DIAGNOSIS — I10 Essential (primary) hypertension: Secondary | ICD-10-CM

## 2023-11-01 DIAGNOSIS — E669 Obesity, unspecified: Secondary | ICD-10-CM

## 2023-11-01 MED ORDER — LISINOPRIL 40 MG PO TABS: 40.0000 mg | ORAL_TABLET | Freq: Every day | ORAL | 1 refills | Status: DC

## 2023-11-01 MED ORDER — AMLODIPINE BESYLATE 5 MG PO TABS: 5.0000 mg | ORAL_TABLET | Freq: Every day | ORAL | 1 refills | Status: DC

## 2023-11-01 NOTE — Patient Instructions (Addendum)
Return in about 6 months (around 05/13/2024) for cpe (20 min), Routine chronic condition follow-up.        Great to see you today.  I have refilled the medication(s) we provide.   If labs were collected or images ordered, we will inform you of  results once we have received them and reviewed. We will contact you either by echart message, or telephone call.  Please give ample time to the testing facility, and our office to run,  receive and review results. Please do not call inquiring of results, even if you can see them in your chart. We will contact you as soon as we are able. If it has been over 1 week since the test was completed, and you have not yet heard from [place], then please call us.    - echart message- for normal results that have been seen by the patient already.   - telephone call: abnormal results or if patient has not viewed results in their echart.  If a referral to a specialist was entered for you, please call us in 2 weeks if you have not heard from the specialist office to schedule.

## 2023-11-01 NOTE — Progress Notes (Signed)
Austin Trevino , 03/07/1966, 57 y.o., male MRN: [id number] Patient Care Team    Relationship Specialty Notifications Start End  Natalia Leatherwood, DO PCP - General Family Medicine  06/29/15   Clance, Maree Krabbe, MD Consulting Physician Pulmonary Disease  03/25/15   Iva Boop, MD Consulting Physician Gastroenterology  10/04/19     Chief Complaint  Patient presents with   Hypertension     Subjective: Austin Trevino is a 57 y.o. male present for chronic condition management. All past medical history, surgical history, allergies, family history, immunizations and social history were updated in  into the electronic medical record.  Hypertension:  Pt reports compliance with lisinopril 40 mg and amlodipine 5 mg QD. Patient denies chest pain, shortness of breath, dizziness or lower extremity edema.  Diet: low sodium     05/12/2023    8:46 AM 11/25/2022    8:54 AM 04/11/2022    8:11 AM 10/11/2021   11:03 AM 11/03/2020   10:41 AM  Depression screen PHQ 2/9  Decreased Interest 0 0 0 0 0  Down, Depressed, Hopeless 0 0 0 0 0  PHQ - 2 Score 0 0 0 0 0  Altered sleeping   3    Tired, decreased energy   1    Change in appetite   1    Feeling bad or failure about yourself    0    Trouble concentrating   0    Moving slowly or fidgety/restless   0    Suicidal thoughts   0    PHQ-9 Score   5      No Known Allergies Social History   Social History Narrative   Divorced, 3 sons in middle school/HS [employer]).   Educ: BS management from [place].  Orig from [place].   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   No tob, alcohol intake occasional/social.   Past Medical History:  Diagnosis Date   COVID-19    GERD (gastroesophageal reflux disease)    GERD with apnea 09/26/2022   Hematuria    Hx of colonic polyps - adenomas and ssp 02/18/2015   Hypertension    treated "years ago" but he did some TLC/lost wt and was able to get off meds.   Obesity, Class I, BMI 30-34.9     OSA (obstructive sleep apnea) 01/2015   Suspected: eval by Dr. Shelle Iron 02/2015, sleep study recommended.never completed.    Synovial cyst 08/10/2021   Past Surgical History:  Procedure Laterality Date   COLONOSCOPY     COLONOSCOPY W/ POLYPECTOMY     Family History  Problem Relation Age of Onset   Arthritis Mother    Hypertension Mother    Colon polyps Mother    Hypertension Father    Myelodysplastic syndrome Father    Cancer Father    Neuropathy Sister        amyloidosis   Prostate cancer Paternal Grandfather    Asthma Paternal Grandmother    Colon cancer Neg Hx    Rectal cancer Neg Hx    Stomach cancer Neg Hx    Allergies as of 11/01/2023   No Known Allergies      Medication List        Accurate as of November 01, 2023  8:51 AM. If you have any questions, ask your nurse or doctor.          amLODipine 5 MG tablet Commonly known as: NORVASC Take 1 tablet (5 mg  total) by mouth daily.   finasteride 5 MG tablet Commonly known as: PROSCAR Take 5 mg by mouth daily.   lisinopril 40 MG tablet Commonly known as: ZESTRIL Take 1 tablet (40 mg total) by mouth daily.   meloxicam 15 MG tablet Commonly known as: MOBIC Take 1 tablet by mouth daily as needed.   multivitamin tablet Take 1 tablet by mouth daily.   tamsulosin 0.4 MG Caps capsule Commonly known as: FLOMAX Take 0.4 mg by mouth daily.   traZODone 50 MG tablet Commonly known as: DESYREL Take 25-50mg  nightly as needed for sleep   triamcinolone 55 MCG/ACT Aero nasal inhaler Commonly known as: NASACORT Place 2 sprays into the nose daily.        All past medical history, surgical history, allergies, family history, immunizations andmedications were updated in the EMR today and reviewed under the history and medication portions of their EMR.     ROS Negative, with the exception of above mentioned in HPI  Objective:  BP 108/70   Pulse (!) 59   Temp 98.5 F (36.9 C)   Wt 236 lb (107 kg)   SpO2 96%    BMI 32.01 kg/m  Body mass index is 32.01 kg/m. Physical Exam Vitals and nursing note reviewed.  Constitutional:      General: He is not in acute distress.    Appearance: Normal appearance. He is not ill-appearing, toxic-appearing or diaphoretic.  HENT:     Head: Normocephalic and atraumatic.  Eyes:     General: No scleral icterus.       Right eye: No discharge.        Left eye: No discharge.     Extraocular Movements: Extraocular movements intact.     Pupils: Pupils are equal, round, and reactive to light.  Cardiovascular:     Rate and Rhythm: Normal rate and regular rhythm.  Pulmonary:     Effort: Pulmonary effort is normal. No respiratory distress.     Breath sounds: Normal breath sounds. No wheezing, rhonchi or rales.  Musculoskeletal:     Right lower leg: No edema.     Left lower leg: No edema.  Skin:    General: Skin is warm.     Findings: No rash.  Neurological:     Mental Status: He is alert and oriented to person, place, and time. Mental status is at baseline.  Psychiatric:        Mood and Affect: Mood normal.        Behavior: Behavior normal.        Thought Content: Thought content normal.        Judgment: Judgment normal.     No results found. No results found. No results found for this or any previous visit (from the past 24 hour(s)).  Assessment/Plan: Austin Trevino is a 57 y.o. male present for chronic condition management Essential hypertension/ Obesity (BMI 30-39.9) Stable Continue amlodipine to 5 mg qd Continue lisinopril 40 mg qd.  Labs up-to-date-due next visit   Reviewed expectations re: course of current medical issues. Discussed self-management of symptoms. Outlined signs and symptoms indicating need for more acute intervention. Patient verbalized understanding and all questions were answered. Patient received an After-Visit Summary.   Return in about 6 months (around 05/13/2024) for cpe (20 min), Routine chronic condition  follow-up.  No orders of the defined types were placed in this encounter.  Meds ordered this encounter  Medications   amLODipine (NORVASC) 5 MG tablet    Sig: Take  1 tablet (5 mg total) by mouth daily.    Dispense:  90 tablet    Refill:  1   lisinopril (ZESTRIL) 40 MG tablet    Sig: Take 1 tablet (40 mg total) by mouth daily.    Dispense:  90 tablet    Refill:  1   Referral Orders  No referral(s) requested today      Note is dictated utilizing voice recognition software. Although note has been proof read prior to signing, occasional typographical errors still can be missed. If any questions arise, please do not hesitate to call for verification.   electronically signed by:  Felix Pacini, DO  [facility] [place]

## 2023-12-26 ENCOUNTER — Other Ambulatory Visit: Payer: Self-pay | Admitting: Neurology

## 2023-12-27 NOTE — Telephone Encounter (Signed)
Last seen on 08/08/23 per note " Continue Nasacort as needed for congestion " Follow up scheduled on 02/20/24

## 2023-12-30 DIAGNOSIS — J020 Streptococcal pharyngitis: Secondary | ICD-10-CM | POA: Diagnosis not present

## 2023-12-30 DIAGNOSIS — R051 Acute cough: Secondary | ICD-10-CM | POA: Diagnosis not present

## 2023-12-30 DIAGNOSIS — R509 Fever, unspecified: Secondary | ICD-10-CM | POA: Diagnosis not present

## 2024-02-19 NOTE — Progress Notes (Unsigned)
 [facility] [address] [place].  [id number] [phone]       OFFICE FOLLOW UP NOTE  Mr. Austin Austin Date of Birth:  July 10, 1966 Medical Record Number:  [id number]    Primary neurologist: Dr. Vickey Huger Reason for visit: CPAP follow-up    SUBJECTIVE:   CHIEF COMPLAINT:  No chief complaint on file.  Follow-up visit:  Prior visit: 08/08/2023  Brief HPI:   Austin Austin is a 58 y.o. male who is being seen for initial CPAP compliance visit.  Main complaints at initial visit was waking himself up snoring, feeling congested, and insomnia.  Underwent sleep study 09/12/2022 which showed severe sleep disordered breathing with an AHI of 38.2/h consisting solely of hypopneas and recommend initiation of AutoPap.  AutoPap set up date 11/29/2022.    Interval history:  Compliance report shows lower greater than 4-hour usage at 67% although optimal residual AHI with use and compliance has improved compared to prior visit.  Use of trazodone ***    Does still have some difficulty tolerating nasal pillow at times with congestion, did not obtain FFM due to busy work schedule but still interested in pursuing.  He continues to struggle with insomnia, bedtime 10-11pm, will wake around 1-2am and have difficulty falling back asleep, will eventually go back to sleep until 6-630am. Has tried Tylenol PM but doesn't always help and can leave grogginess sensation. Ensures good sleep hygiene, keeps room cool, use of blackout curtains, no TV and no pets.  He does travel frequently for work and at times unable to use CPAP.    Epworth Sleepiness Scale 8/24 (prior to CPAP 11/24) Fatigue severity scale 22/63 (prior to CPAP 29/63)             ROS:   14 system review of systems performed and negative with exception of those listed in HPI  PMH:  Past Medical History:  Diagnosis Date   COVID-19    GERD (gastroesophageal reflux disease)    GERD with apnea 09/26/2022    Hematuria    Hx of colonic polyps - adenomas and ssp 02/18/2015   Hypertension    treated "years ago" but he did some TLC/lost wt and was able to get off meds.   Obesity, Class I, BMI 30-34.9    OSA (obstructive sleep apnea) 01/2015   Suspected: eval by Dr. Shelle Iron 02/2015, sleep study recommended.never completed.    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   Educ: BS management from [place].  Orig from [place].   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  No tob, alcohol intake occasional/social.   Social Drivers of Corporate investment banker Strain: Not on file  Food Insecurity: Not on file  Transportation Needs: Not on file  Physical Activity: Not on file  Stress: Not on file  Social Connections: Not on file  Intimate Partner Violence: Not on file    Family History:  Family History  Problem Relation Age of Onset   Arthritis Mother    Hypertension Mother    Colon polyps Mother    Hypertension Father    Myelodysplastic syndrome Father    Cancer Father    Neuropathy Sister        amyloidosis   Prostate cancer Paternal Grandfather    Asthma Paternal Grandmother    Colon cancer Neg Hx     Rectal cancer Neg Hx    Stomach cancer Neg Hx     Medications:   Current Outpatient Medications on File Prior to Visit  Medication Sig Dispense Refill   amLODipine (NORVASC) 5 MG tablet Take 1 tablet (5 mg total) by mouth daily. 90 tablet 1   finasteride (PROSCAR) 5 MG tablet Take 5 mg by mouth daily.     lisinopril (ZESTRIL) 40 MG tablet Take 1 tablet (40 mg total) by mouth daily. 90 tablet 1   meloxicam (MOBIC) 15 MG tablet Take 1 tablet by mouth daily as needed.     Multiple Vitamin (MULTIVITAMIN) tablet Take 1 tablet by mouth daily.     tamsulosin (FLOMAX) 0.4 MG CAPS capsule Take 0.4 mg by mouth daily.     traZODone (DESYREL) 50 MG tablet Take 25-50mg  nightly as needed for sleep 30 tablet 5   triamcinolone (NASACORT) 55 MCG/ACT AERO nasal inhaler Place 2 sprays into each nostril daily 16.9 mL 2   No current facility-administered medications on file prior to visit.    Allergies:  No Known Allergies    OBJECTIVE:  Physical Exam  There were no vitals filed for this visit.   There is no height or weight on file to calculate BMI. No results found.   General: well developed, well nourished, very pleasant middle-aged Caucasian male, seated, in no evident distress Head: head normocephalic and atraumatic.   Neck: supple with no carotid or supraclavicular bruits Cardiovascular: regular rate and rhythm, no murmurs Musculoskeletal: no deformity Skin:  no rash/petichiae Vascular:  Normal pulses all extremities   Neurologic Exam Mental Status: Awake and fully alert. Oriented to place and time. Recent and remote memory intact. Attention span, concentration and fund of knowledge appropriate. Mood and affect appropriate.  Cranial Nerves: Pupils equal, briskly reactive to light. Extraocular movements full without nystagmus. Visual fields full to confrontation. Hearing intact. Facial sensation intact. Face, tongue, palate moves normally and symmetrically.  Motor: Normal bulk and tone.  Normal strength in all tested extremity muscles Sensory.: intact to touch , pinprick , position and vibratory sensation.  Coordination: Rapid alternating movements normal in all extremities. Finger-to-nose and heel-to-shin performed accurately bilaterally. Gait and Station: Arises from chair without difficulty. Stance is normal. Gait demonstrates normal stride length and balance without use of AD.  Reflexes: 1+ and symmetric. Toes downgoing.         ASSESSMENT/PLAN: Austin Austin is a 58 y.o. year old male    OSA on CPAP :  Insomnia:  Continued difficulty using greater than 4 hours due to nightly insomnia, recommend trying trazodone 25-50mg  as needed for sleep.   Discussed importance of nightly CPAP use with use throughout entire duration of sleep  and with any daytime naps for optimal benefit. Will place order again to DME to be fitted for FFM due to difficulty with nasal pillow with congestion.  Continue same pressure settings. Continue to follow with DME company for any needed supplies or CPAP related concerns     Follow up in 6 months or call earlier if needed   CC:  PCP: Kuneff, Renee A, DO    I spent 25 minutes of face-to-face and non-face-to-face time with patient.  This included previsit chart review, lab review, study review, order entry, electronic health record documentation, patient education regarding above diagnoses and treatment plan and answered all other questions to patient satisfaction   Austin Austin, [facility]  [facility] [address] [place], [place] [address]  Phone [phone] Fax [phone] Note: This document was prepared with digital dictation and possible smart phrase technology. Any transcriptional errors that result from this process are unintentional.

## 2024-02-20 ENCOUNTER — Encounter: Payer: Self-pay | Admitting: Adult Health

## 2024-02-20 ENCOUNTER — Ambulatory Visit: Payer: BC Managed Care – PPO | Admitting: Adult Health

## 2024-02-20 VITALS — BP 145/86 | HR 62 | Ht 72.0 in | Wt 247.0 lb

## 2024-02-20 DIAGNOSIS — G4733 Obstructive sleep apnea (adult) (pediatric): Secondary | ICD-10-CM | POA: Diagnosis not present

## 2024-02-20 DIAGNOSIS — G4700 Insomnia, unspecified: Secondary | ICD-10-CM | POA: Diagnosis not present

## 2024-02-20 MED ORDER — TRAZODONE HCL 50 MG PO TABS: 75.0000 mg | ORAL_TABLET | Freq: Every evening | ORAL | 11 refills | Status: AC | PRN

## 2024-02-20 NOTE — Patient Instructions (Addendum)
 Your Plan:  Increase trazodone to 75mg  nightly as needed - please call if symptoms persist or if any difficulty tolerating   Ensure nightly use of CPAP with greater than 4 hours per night for optimal benefit  Continue to follow with your DME company [facility] for any needs supplies or CPAP related concerns    Follow-up in 1 year or call earlier if needed     Thank you for coming to see [place] at [facility]. I hope we have been able to provide you high quality care today.  You may receive a patient satisfaction survey over the next few weeks. We would appreciate your feedback and comments so that we may continue to improve ourselves and the health of our patients.

## 2024-02-22 ENCOUNTER — Telehealth: Payer: Self-pay

## 2024-05-17 ENCOUNTER — Encounter: Payer: BC Managed Care – PPO | Admitting: Family Medicine

## 2024-05-27 ENCOUNTER — Other Ambulatory Visit: Payer: Self-pay | Admitting: Family Medicine

## 2024-06-25 ENCOUNTER — Other Ambulatory Visit: Payer: Self-pay | Admitting: Family Medicine

## 2024-06-27 ENCOUNTER — Other Ambulatory Visit: Payer: Self-pay | Admitting: Family Medicine

## 2024-07-04 ENCOUNTER — Ambulatory Visit: Admitting: Family Medicine

## 2024-07-04 ENCOUNTER — Encounter: Payer: Self-pay | Admitting: Family Medicine

## 2024-07-04 VITALS — BP 126/82 | HR 60 | Temp 98.2°F | Ht 72.5 in | Wt 238.0 lb

## 2024-07-04 DIAGNOSIS — Z860100 Personal history of colon polyps, unspecified: Secondary | ICD-10-CM | POA: Diagnosis not present

## 2024-07-04 DIAGNOSIS — E559 Vitamin D deficiency, unspecified: Secondary | ICD-10-CM | POA: Diagnosis not present

## 2024-07-04 DIAGNOSIS — G4733 Obstructive sleep apnea (adult) (pediatric): Secondary | ICD-10-CM | POA: Diagnosis not present

## 2024-07-04 DIAGNOSIS — Z0000 Encounter for general adult medical examination without abnormal findings: Secondary | ICD-10-CM

## 2024-07-04 DIAGNOSIS — Z131 Encounter for screening for diabetes mellitus: Secondary | ICD-10-CM

## 2024-07-04 DIAGNOSIS — I10 Essential (primary) hypertension: Secondary | ICD-10-CM

## 2024-07-04 DIAGNOSIS — E669 Obesity, unspecified: Secondary | ICD-10-CM | POA: Diagnosis not present

## 2024-07-04 LAB — CBC
HCT: 47.7 % (ref 39.0–52.0)
Hemoglobin: 16 g/dL (ref 13.0–17.0)
MCHC: 33.6 g/dL (ref 30.0–36.0)
MCV: 93.9 fl (ref 78.0–100.0)
Platelets: 207 K/uL (ref 150.0–400.0)
RBC: 5.07 Mil/uL (ref 4.22–5.81)
RDW: 14.2 % (ref 11.5–15.5)
WBC: 7.5 K/uL (ref 4.0–10.5)

## 2024-07-04 LAB — VITAMIN D 25 HYDROXY (VIT D DEFICIENCY, FRACTURES): VITD: 40.31 ng/mL (ref 30.00–100.00)

## 2024-07-04 LAB — HEMOGLOBIN A1C: Hgb A1c MFr Bld: 5.7 % (ref 4.6–6.5)

## 2024-07-04 LAB — TSH: TSH: 2.27 u[IU]/mL (ref 0.35–5.50)

## 2024-07-04 MED ORDER — AMLODIPINE BESYLATE 5 MG PO TABS: 5.0000 mg | ORAL_TABLET | Freq: Every day | ORAL | 1 refills | Status: AC

## 2024-07-04 MED ORDER — LISINOPRIL 40 MG PO TABS: 40.0000 mg | ORAL_TABLET | Freq: Every day | ORAL | 1 refills | Status: AC

## 2024-07-04 NOTE — Progress Notes (Signed)
 Austin Trevino , 11/11/1966, 58 y.o., male MRN: [id number] Patient Care Team    Relationship Specialty Notifications Start End  Catherine Charlies LABOR, DO PCP - General Family Medicine  06/29/15   Clance, Francis HERO, MD Consulting Physician Pulmonary Disease  03/25/15   Avram Lupita BRAVO, MD Consulting Physician Gastroenterology  10/04/19   Devere Lonni Righter, MD Consulting Physician Urology  07/04/24   Dohmeier, Dedra, MD Consulting Physician Neurology  07/04/24   Duwayne Reyes, MD Consulting Physician Orthopedic Surgery  07/04/24     Chief Complaint  Patient presents with   Annual Exam    Chronic Conditions/illness Management. Pt is fasting.       Subjective: Austin Trevino is a 58 y.o. male present for CPE and chronic condition management. All past medical history, surgical history, allergies, family history, immunizations and social history were updated in  into the electronic medical record.  Health maintenance:  Colonoscopy: completed 11/2019, by Dr. Avram, . follow up 5 yrs Immunizations: tdap UTD 2018, Influenza UTD  (encouraged yearly), Shingrix  completed,   Infectious disease screening: HIV declined and Hep C completed  PSA: pt follows with urology Assistive device: none Oxygen [id number] Patient has a Dental home. Hospitalizations/ED visits: Reviewed  Hypertension:  Pt reports compliance with lisinopril  40 mg and amlodipine  5 mg QD. Patient denies chest pain, shortness of breath, dizziness or lower extremity edema.   Diet: low sodium     07/04/2024    8:25 AM 05/12/2023    8:46 AM 11/25/2022    8:54 AM 04/11/2022    8:11 AM 10/11/2021   11:03 AM  Depression screen PHQ 2/9  Decreased Interest 0 0 0 0 0  Down, Depressed, Hopeless 0 0 0 0 0  PHQ - 2 Score 0 0 0 0 0  Altered sleeping 2   3   Tired, decreased energy 1   1   Change in appetite 0   1   Feeling bad or failure about yourself  0   0   Trouble concentrating 0   0   Moving slowly or fidgety/restless 0   0    Suicidal thoughts 0   0   PHQ-9 Score 3   5   Difficult doing work/chores Not difficult at all        No Known Allergies Social History   Social History Narrative   Divorced, 3 sons in middle school/HS [employer]).   Educ: BS management from [place].  Orig from  .   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   No tob, alcohol intake occasional/social.   Past Medical History:  Diagnosis Date   COVID-19    GERD (gastroesophageal reflux disease)    GERD with apnea 09/26/2022   Hematuria    Hx of colonic polyps - adenomas and ssp 02/18/2015   Hypertension    treated years ago but he did some TLC/lost wt and was able to get off meds.   Obesity, Class I, BMI 30-34.9    OSA (obstructive sleep apnea) 01/2015   Suspected: eval by Dr. Corrie 02/2015, sleep study recommended.never completed.    Synovial cyst 08/10/2021   Past Surgical History:  Procedure Laterality Date   COLONOSCOPY     COLONOSCOPY W/ POLYPECTOMY     Family History  Problem Relation Age of Onset   Arthritis Mother    Hypertension Mother    Colon polyps Mother    Hypertension Father    Myelodysplastic syndrome Father  Cancer Father    Neuropathy Sister        amyloidosis   Prostate cancer Paternal Grandfather    Asthma Paternal Grandmother    Colon cancer Neg Hx    Rectal cancer Neg Hx    Stomach cancer Neg Hx    Allergies as of 07/04/2024   No Known Allergies      Medication List        Accurate as of July 04, 2024  8:25 AM. If you have any questions, ask your nurse or doctor.          STOP taking these medications    meloxicam 15 MG tablet Commonly known as: MOBIC Stopped by: Charlies Bellini       TAKE these medications    amLODipine  5 MG tablet Commonly known as: NORVASC  Take 1 tablet (5 mg total) by mouth daily.   finasteride 5 MG tablet Commonly known as: PROSCAR Take 5 mg by mouth daily.   lisinopril  40 MG tablet Commonly known as: ZESTRIL  Take 1 tablet  (40 mg total) by mouth daily.   multivitamin tablet Take 1 tablet by mouth daily.   tamsulosin 0.4 MG Caps capsule Commonly known as: FLOMAX Take 0.4 mg by mouth daily.   traZODone  50 MG tablet Commonly known as: DESYREL  Take 1.5 tablets (75 mg total) by mouth at bedtime as needed for sleep.   triamcinolone  55 MCG/ACT Aero nasal inhaler Commonly known as: NASACORT  Place 2 sprays into each nostril daily        All past medical history, surgical history, allergies, family history, immunizations andmedications were updated in the EMR today and reviewed under the history and medication portions of their EMR.     ROS Negative, with the exception of above mentioned in HPI  Objective:  BP 126/82   Pulse 60   Temp 98.2 F (36.8 C)   Ht 6' 0.5 (1.842 m)   Wt 238 lb (108 kg)   SpO2 96%   BMI 31.83 kg/m  Body mass index is 31.83 kg/m. Physical Exam Vitals and nursing note reviewed. Exam conducted with a chaperone present.  Constitutional:      General: He is not in acute distress.    Appearance: Normal appearance. He is obese. He is not ill-appearing, toxic-appearing or diaphoretic.  HENT:     Head: Normocephalic and atraumatic.     Right Ear: Tympanic membrane, ear canal and external ear normal. There is no impacted cerumen.     Left Ear: Tympanic membrane, ear canal and external ear normal. There is no impacted cerumen.     Nose: Nose normal. No congestion or rhinorrhea.     Mouth/Throat:     Mouth: Mucous membranes are moist.     Pharynx: Oropharynx is clear. No oropharyngeal exudate or posterior oropharyngeal erythema.  Eyes:     General: No scleral icterus.       Right eye: No discharge.        Left eye: No discharge.     Extraocular Movements: Extraocular movements intact.     Pupils: Pupils are equal, round, and reactive to light.  Cardiovascular:     Rate and Rhythm: Normal rate and regular rhythm.     Pulses: Normal pulses.     Heart sounds: Normal heart  sounds. No murmur heard.    No friction rub. No gallop.  Pulmonary:     Effort: Pulmonary effort is normal. No respiratory distress.     Breath sounds: Normal breath sounds.  No stridor. No wheezing, rhonchi or rales.  Chest:     Chest wall: No tenderness.  Abdominal:     General: Abdomen is flat. Bowel sounds are normal. There is no distension.     Palpations: Abdomen is soft. There is no mass.     Tenderness: There is no abdominal tenderness. There is no right CVA tenderness, left CVA tenderness, guarding or rebound.     Hernia: No hernia is present.  Musculoskeletal:        General: No swelling or tenderness. Normal range of motion.     Cervical back: Normal range of motion and neck supple.     Right lower leg: No edema.     Left lower leg: No edema.  Lymphadenopathy:     Cervical: No cervical adenopathy.  Skin:    General: Skin is warm and dry.     Coloration: Skin is not jaundiced.     Findings: No bruising, lesion or rash.  Neurological:     General: No focal deficit present.     Mental Status: He is alert and oriented to person, place, and time. Mental status is at baseline.     Cranial Nerves: No cranial nerve deficit.     Sensory: No sensory deficit.     Motor: No weakness.     Coordination: Coordination normal.     Gait: Gait normal.     Deep Tendon Reflexes: Reflexes normal.  Psychiatric:        Mood and Affect: Mood normal.        Behavior: Behavior normal.        Thought Content: Thought content normal.        Judgment: Judgment normal.     No results found. No results found. No results found for this or any previous visit (from the past 24 hours).  Assessment/Plan: Austin Trevino is a 58 y.o. male present for CPE and combined chronic condition management Essential hypertension/ Obesity (BMI 30-39.9) Stable Continue amlodipine  to 5 mg qd Continue lisinopril  40 mg qd.   OSA on CPAP compliant  Hx of colonic polyps - adenomas and ssp UTD Diabetes  mellitus screening - Hemoglobin A1c Vitamin D  deficiency - Vitamin D  (25 hydroxy)  Routine general medical examination at a health care facility (Primary) - CBC - Comprehensive metabolic panel with GFR - TSH Patient was encouraged to exercise greater than 150 minutes a week. Patient was encouraged to choose a diet filled with fresh fruits and vegetables, and lean meats. AVS provided to patient today for education/recommendation on gender specific health and safety maintenance. Colonoscopy: completed 11/2019, by Dr. Avram, . follow up 5 yrs Immunizations: tdap UTD 2018, Influenza UTD  (encouraged yearly), Shingrix  completed,   Infectious disease screening: HIV declined and Hep C completed  PSA: pt follows with urology   Reviewed expectations re: course of current medical issues. Discussed self-management of symptoms. Outlined signs and symptoms indicating need for more acute intervention. Patient verbalized understanding and all questions were answered. Patient received an After-Visit Summary.   Return in about 6 months (around 12/25/2024) for Routine chronic condition follow-up.  Orders Placed This Encounter  Procedures   CBC   Comprehensive metabolic panel with GFR   Hemoglobin A1c   Lipid panel   TSH   Vitamin D  (25 hydroxy)   Meds ordered this encounter  Medications   amLODipine  (NORVASC ) 5 MG tablet    Sig: Take 1 tablet (5 mg total) by mouth daily.    Dispense:  90 tablet    Refill:  1   lisinopril  (ZESTRIL ) 40 MG tablet    Sig: Take 1 tablet (40 mg total) by mouth daily.    Dispense:  90 tablet    Refill:  1   Referral Orders  No referral(s) requested today      Note is dictated utilizing voice recognition software. Although note has been proof read prior to signing, occasional typographical errors still can be missed. If any questions arise, please do not hesitate to call for verification.   electronically signed by:  Charlies Bellini, DO  [facility] [place]

## 2024-07-04 NOTE — Patient Instructions (Addendum)
 Return in about 6 months (around 12/25/2024) for Routine chronic condition follow-up.        Great to see you today.  I have refilled the medication(s) we provide.   If labs were collected or images ordered, we will inform you of  results once we have received them and reviewed. We will contact you either by echart message, or telephone call.  Please give ample time to the testing facility, and our office to run,  receive and review results. Please do not call inquiring of results, even if you can see them in your chart. We will contact you as soon as we are able. If it has been over 1 week since the test was completed, and you have not yet heard from us , then please call us .    - echart message- for normal results that have been seen by the patient already.   - telephone call: abnormal results or if patient has not viewed results in their echart.  If a referral to a specialist was entered for you, please call us  in 2 weeks if you have not heard from the specialist office to schedule.

## 2024-07-05 ENCOUNTER — Ambulatory Visit: Payer: Self-pay | Admitting: Family Medicine

## 2024-07-05 LAB — LIPID PANEL
Cholesterol: 200 mg/dL (ref 0–200)
HDL: 47.1 mg/dL (ref >39.00)
LDL Cholesterol: 127 mg/dL — ABNORMAL HIGH (ref 0–99)
NonHDL: 152.42
Total CHOL/HDL Ratio: 4
Triglycerides: 129 mg/dL (ref 0.0–149.0)
VLDL: 25.8 mg/dL (ref 0.0–40.0)

## 2024-07-05 LAB — COMPREHENSIVE METABOLIC PANEL WITH GFR
ALT: 37 U/L (ref 0–53)
AST: 22 U/L (ref 0–37)
Albumin: 4.3 g/dL (ref 3.5–5.2)
Alkaline Phosphatase: 59 U/L (ref 39–117)
BUN: 17 mg/dL (ref 6–23)
CO2: 28 meq/L (ref 19–32)
Calcium: 9.2 mg/dL (ref 8.4–10.5)
Chloride: 102 meq/L (ref 96–112)
Creatinine, Ser: 1.16 mg/dL (ref 0.40–1.50)
GFR: 68.7 mL/min (ref >60.00)
Glucose, Bld: 86 mg/dL (ref 70–99)
Potassium: 4.7 meq/L (ref 3.5–5.1)
Sodium: 140 meq/L (ref 135–145)
Total Bilirubin: 0.7 mg/dL (ref 0.2–1.2)
Total Protein: 7 g/dL (ref 6.0–8.3)

## 2024-07-23 DIAGNOSIS — H2513 Age-related nuclear cataract, bilateral: Secondary | ICD-10-CM | POA: Diagnosis not present

## 2024-07-23 DIAGNOSIS — D3131 Benign neoplasm of right choroid: Secondary | ICD-10-CM | POA: Diagnosis not present

## 2024-07-23 DIAGNOSIS — D3132 Benign neoplasm of left choroid: Secondary | ICD-10-CM | POA: Diagnosis not present

## 2024-09-23 DIAGNOSIS — N400 Enlarged prostate without lower urinary tract symptoms: Secondary | ICD-10-CM | POA: Diagnosis not present

## 2024-09-23 DIAGNOSIS — N5201 Erectile dysfunction due to arterial insufficiency: Secondary | ICD-10-CM | POA: Diagnosis not present

## 2025-01-06 ENCOUNTER — Ambulatory Visit: Admitting: Family Medicine

## 2025-02-19 ENCOUNTER — Ambulatory Visit: Admitting: Adult Health
